# Patient Record
Sex: Female | Born: 1970 | Hispanic: No | Marital: Married | Smoking: Former smoker
Health system: Southern US, Community
[De-identification: ages and names within clinical notes are randomized; demographics above are authoritative.]

## PROBLEM LIST (undated history)

## (undated) DIAGNOSIS — I10 Essential (primary) hypertension: Secondary | ICD-10-CM

## (undated) DIAGNOSIS — Z789 Other specified health status: Secondary | ICD-10-CM

## (undated) DIAGNOSIS — T7840XA Allergy, unspecified, initial encounter: Secondary | ICD-10-CM

## (undated) DIAGNOSIS — Q232 Congenital mitral stenosis: Secondary | ICD-10-CM

## (undated) DIAGNOSIS — G709 Myoneural disorder, unspecified: Secondary | ICD-10-CM

## (undated) HISTORY — DX: Essential (primary) hypertension: I10

## (undated) HISTORY — PX: NO PAST SURGERIES: SHX2092

## (undated) HISTORY — DX: Allergy, unspecified, initial encounter: T78.40XA

## (undated) HISTORY — DX: Congenital mitral stenosis: Q23.2

## (undated) HISTORY — DX: Myoneural disorder, unspecified: G70.9

---

## 2005-01-28 ENCOUNTER — Other Ambulatory Visit: Admission: RE | Admit: 2005-01-28 | Discharge: 2005-01-28 | Payer: Self-pay | Admitting: Obstetrics and Gynecology

## 2013-09-07 DIAGNOSIS — G35 Multiple sclerosis: Secondary | ICD-10-CM | POA: Insufficient documentation

## 2013-09-12 ENCOUNTER — Inpatient Hospital Stay (HOSPITAL_COMMUNITY)
Admission: EM | Admit: 2013-09-12 | Discharge: 2013-09-14 | DRG: 060 | Disposition: A | Discharge status: Home Health | Payer: BC Managed Care – PPO | Attending: Internal Medicine | Admitting: Internal Medicine

## 2013-09-12 ENCOUNTER — Encounter (HOSPITAL_COMMUNITY): Payer: Self-pay | Admitting: Emergency Medicine

## 2013-09-12 ENCOUNTER — Emergency Department (HOSPITAL_COMMUNITY): Payer: BC Managed Care – PPO

## 2013-09-12 DIAGNOSIS — R209 Unspecified disturbances of skin sensation: Secondary | ICD-10-CM

## 2013-09-12 DIAGNOSIS — G35 Multiple sclerosis: Principal | ICD-10-CM | POA: Diagnosis present

## 2013-09-12 DIAGNOSIS — R200 Anesthesia of skin: Secondary | ICD-10-CM | POA: Diagnosis present

## 2013-09-12 DIAGNOSIS — Z87891 Personal history of nicotine dependence: Secondary | ICD-10-CM

## 2013-09-12 HISTORY — DX: Other specified health status: Z78.9

## 2013-09-12 LAB — BASIC METABOLIC PANEL
BUN: 8 mg/dL (ref 6–23)
CO2: 24 mEq/L (ref 19–32)
Chloride: 105 mEq/L (ref 96–112)
Creatinine, Ser: 0.71 mg/dL (ref 0.50–1.10)
GFR calc Af Amer: >90 mL/min (ref >90)
Glucose, Bld: 84 mg/dL (ref 70–99)
Potassium: 4 mEq/L (ref 3.5–5.1)
Sodium: 139 mEq/L (ref 135–145)

## 2013-09-12 LAB — CBC WITH DIFFERENTIAL/PLATELET
Basophils Relative: 0 % (ref 0–1)
Eosinophils Absolute: 0.1 10*3/uL (ref 0.0–0.7)
HCT: 40.5 % (ref 36.0–46.0)
Hemoglobin: 13.7 g/dL (ref 12.0–15.0)
Lymphs Abs: 1.8 10*3/uL (ref 0.7–4.0)
MCH: 30 pg (ref 26.0–34.0)
MCHC: 33.8 g/dL (ref 30.0–36.0)
Monocytes Absolute: 0.5 10*3/uL (ref 0.1–1.0)
Monocytes Relative: 6 % (ref 3–12)
Neutro Abs: 5.6 10*3/uL (ref 1.7–7.7)
Neutrophils Relative %: 70 % (ref 43–77)
RBC: 4.57 MIL/uL (ref 3.87–5.11)

## 2013-09-12 LAB — PREGNANCY, URINE: Preg Test, Ur: NEGATIVE

## 2013-09-12 LAB — SEDIMENTATION RATE: Sed Rate: 3 mm/hr (ref 0–22)

## 2013-09-12 MED ORDER — ONDANSETRON HCL 4 MG PO TABS: 4.0000 mg | ORAL_TABLET | Freq: Four times a day (QID) | ORAL | Status: DC | PRN

## 2013-09-12 MED ORDER — ACETAMINOPHEN 325 MG PO TABS
650.0000 mg | ORAL_TABLET | Freq: Four times a day (QID) | ORAL | Status: DC | PRN
  Administered 2013-09-14: 650 mg via ORAL
  Filled 2013-09-12: qty 2

## 2013-09-12 MED ORDER — GADOBENATE DIMEGLUMINE 529 MG/ML IV SOLN
15.0000 mL | Freq: Once | INTRAVENOUS | Status: AC
  Administered 2013-09-12: 15 mL via INTRAVENOUS

## 2013-09-12 MED ORDER — ACETAMINOPHEN 650 MG RE SUPP: 650.0000 mg | Freq: Four times a day (QID) | RECTAL | Status: DC | PRN

## 2013-09-12 MED ORDER — SODIUM CHLORIDE 0.9 % IV SOLN
INTRAVENOUS | Status: DC
  Administered 2013-09-13: 15:00:00 via INTRAVENOUS

## 2013-09-12 MED ORDER — ONDANSETRON HCL 4 MG/2ML IJ SOLN: 4.0000 mg | Freq: Four times a day (QID) | INTRAMUSCULAR | Status: DC | PRN

## 2013-09-12 NOTE — Progress Notes (Signed)
Called to get report. RN will call this nurse back.

## 2013-09-12 NOTE — ED Notes (Signed)
Family at bedside, patient is still in MRI.

## 2013-09-12 NOTE — ED Provider Notes (Signed)
Full history, physical exam, and ROS can be read from [facility], PA-C note.  Physical Exam  BP 128/85  Pulse 86  Temp(Src) 99.3 F (37.4 C) (Oral)  Resp 16  Wt 135 lb 3.2 oz (61.326 kg)  SpO2 99%  Physical Exam  ED Course  Procedures  MR Thoracic Spine W Wo Contrast (Final result)  Result time: 09/12/13 16:48:17    Final result by Rad Results In Interface (09/12/13 16:48:17)    Narrative:   CLINICAL DATA: Bilateral lower extremity numbness.  EXAM: MRI THORACIC SPINE WITHOUT AND WITH CONTRAST  TECHNIQUE: Multiplanar and multiecho pulse sequences of the thoracic spine were obtained without and with intravenous contrast.  CONTRAST: 15mL MULTIHANCE GADOBENATE DIMEGLUMINE 529 MG/ML IV SOLN  COMPARISON: None.  FINDINGS: The sagittal MRI images demonstrate normal alignment of the thoracic vertebral bodies. They demonstrate normal marrow signal. There is a mild left convex thoracic scoliosis. The thoracic spinal cord demonstrates multiple cord lesions with slight increased T2 and STIR signal intensity. Lesions are noted at T7, T7-8, T10 and T10-11. No definite enhancement postcontrast.  No significant thoracic disc protrusions, spinal or foraminal stenosis. There is a small left paracentral disc protrusion noted at T8-9 and a bulging annulus at T11-12.  IMPRESSION: Thoracic cord lesions suspicious for MS plaques as discussed above. No enhancement.   Electronically Signed By: Loralie Champagne M.D. On: 09/12/2013 16:48             MR Cervical Spine W Wo Contrast (Final result)  Result time: 09/12/13 16:42:20    Final result by Rad Results In Interface (09/12/13 16:42:20)    Narrative:   CLINICAL DATA: Extremity numbness.  EXAM: MRI CERVICAL SPINE WITHOUT AND WITH CONTRAST  TECHNIQUE: Multiplanar and multiecho pulse sequences of the cervical spine, to include the craniocervical junction and cervicothoracic junction, were obtained according to standard  protocol without and with intravenous contrast.  CONTRAST: 15mL MULTIHANCE GADOBENATE DIMEGLUMINE 529 MG/ML IV SOLN  COMPARISON: None.  FINDINGS: Normal alignment of the cervical vertebral bodies. They demonstrate normal marrow signal. The cervical spinal cord demonstrates ill-defined areas of increased T2 and STIR signal intensity. These are concerning for MS plaques. There is a small lesion in the right side of the cord at C4-5 and a larger left-sided cord lesion at C5-6. Suspect a vague lesion at the C2 level and also in the upper pons. No enhancement is identified after contrast administration.  No focal disc protrusions, spinal or foraminal stenosis.  IMPRESSION: MR findings suspicious for multiple sclerosis plaques involving the cervical spine. Lesions are noted at C4-5, C5-6 and also at the C2 level. An upper pons lesion is also noted. MRI of the brain may also be helpful for further evaluation of the remainder of the neural axis.   Electronically Signed By: Loralie Champagne M.D. On: 09/12/2013 16:42     MDM  Patient case discussed with Dr. Leroy Kennedy who recommends inpatient admission for full work up of MS plaques. Patient is agreeable to plan. The patient appears reasonably stabilized for admission considering the current resources, flow, and capabilities available in the ED at this time, and I doubt any other [facility] requiring further screening and/or treatment in the ED prior to admission. Patient d/w with Dr. Wilkie Aye, agrees with plan.         Jeannetta Ellis, PA-C 09/12/13 1917

## 2013-09-12 NOTE — Consult Note (Signed)
NEURO HOSPITALIST CONSULT NOTE    Reason for Consult: paresthesias left lateral middle thoracic-lumbar area and anterior left thigh.   HPI:                                                                                                                                          Kelli Bright is an 42 y.o. female without significant past medical history who was in her usual state of health until 3 days ago when she started experiencing " a strange novocain-like sensation" that initially was localized to the lateral midthoracic-lumbar region without involvement of the ribcage area and subsequently spread to the anterior aspect of the left thigh, left hip area and the front of her left genitalia region. Stated that she probably had some transient tingling of the left arm. Denies weakness of the upper or lower extremities, bladder or bowel impairment, unsteadiness, vertigo, double vision, slurred speech, painful visual loss, painful cramps, difficulty swallowing, or HA. No recent fever, infection, trauma, vaccination, or foreign travel. Her symptoms prompted MRI cervical and thoracic region with and without contras.The cervical spinal cord demonstrates ill-defined areas of increased T2 and STIR signal intensity. These are concerning for MS plaques. There is a small lesion in the right side of the cord at C4-5 and a larger left-sided cord lesion at C5-6. A vague lesion at the C2 level and also in the upper pons also suspected. No enhancement is identified after contrast administration. The thoracic spinal cord demonstrates multiple cord lesions with slight increased T2 and STIR  signal intensity. Lesions are noted at T7, T7-8, T10 and T10-11. No definite enhancement postcontrast.   History reviewed. No pertinent past medical history.  History reviewed. No pertinent past surgical history.  History reviewed. No pertinent family history.  Social History:  reports that she has quit  smoking. She does not have any smokeless tobacco history on file. She reports that she does not drink alcohol or use illicit drugs.  No Known Allergies  MEDICATIONS:                                                                                                                     I have reviewed the patient's current medications.   ROS:  History obtained from the patient  General ROS: negative for - chills, fatigue, fever, night sweats, weight gain or weight loss Psychological ROS: negative for - behavioral disorder, hallucinations, memory difficulties, mood swings or suicidal ideation Ophthalmic ROS: negative for - blurry vision, double vision, eye pain or loss of vision ENT ROS: negative for - epistaxis, nasal discharge, oral lesions, sore throat, tinnitus or vertigo Allergy and Immunology ROS: negative for - hives or itchy/watery eyes Hematological and Lymphatic ROS: negative for - bleeding problems, bruising or swollen lymph nodes Endocrine ROS: negative for - galactorrhea, hair pattern changes, polydipsia/polyuria or temperature intolerance Respiratory ROS: negative for - cough, hemoptysis, shortness of breath or wheezing Cardiovascular ROS: negative for - chest pain, dyspnea on exertion, edema or irregular heartbeat Gastrointestinal ROS: negative for - abdominal pain, diarrhea, hematemesis, nausea/vomiting or stool incontinence Genito-Urinary ROS: negative for - dysuria, hematuria, incontinence or urinary frequency/urgency Musculoskeletal ROS: negative for - joint swelling or muscular weakness Neurological ROS: as noted in HPI Dermatological ROS: negative for rash and skin lesion changes  Physical exam: pleasant female in no apparent distress. Blood pressure 128/85, pulse 86, temperature 99.3 F (37.4 C), temperature source Oral, resp. rate 16, weight  61.326 kg (135 lb 3.2 oz), SpO2 99.00%. Head: normocephalic. Neck: supple, no bruits, no JVD. Cardiac: no murmurs. Lungs: clear. Abdomen: soft, no tender, no mass. Extremities: no edema.  Neurologic Examination:                                                                                                      Mental Status: Alert, oriented, thought content appropriate.  Speech fluent without evidence of aphasia.  Able to follow 3 step commands without difficulty. Cranial Nerves: II: Discs flat bilaterally; Visual fields grossly normal, pupils equal, round, reactive to light and accommodation III,IV, VI: ptosis not present, extra-ocular motions intact bilaterally V,VII: smile symmetric, facial light touch sensation normal bilaterally VIII: hearing normal bilaterally IX,X: gag reflex present XI: bilateral shoulder shrug XII: midline tongue extension without atrophy or fasciculations  Motor: Right : Upper extremity   5/5    Left:     Upper extremity   5/5  Lower extremity   5/5     Lower extremity   5/5 Tone and bulk:normal tone throughout; no atrophy noted Sensory: there is a large area of decreased pinprick and light touch diminished involving the left lateral middle thoracic-lumbar area and anterior left thigh.  Deep Tendon Reflexes:  Significant for hyperreflexia at the knee and ankle jerk bilaterally. No obvious clonus. Plantars: Right: downgoing   Left: downgoing Cerebellar: normal finger-to-nose,  normal heel-to-shin test Gait:  No ataxia. CV: pulses palpable throughout   No results found for this basename: cbc, bmp, coags, chol, tri, ldl, hga1c    Results for orders placed during the hospital encounter of 09/12/13 (from the past 48 hour(s))  CBC WITH DIFFERENTIAL     Status: None   Collection Time    09/12/13  1:15 PM      Result Value Range   WBC 7.9  4.0 - 10.5 K/uL  RBC 4.57  3.87 - 5.11 MIL/uL   Hemoglobin 13.7  12.0 - 15.0 g/dL   HCT 46.9  62.9 - 52.8 %    MCV 88.6  78.0 - 100.0 fL   MCH 30.0  26.0 - 34.0 pg   MCHC 33.8  30.0 - 36.0 g/dL   RDW 41.3  24.4 - 01.0 %   Platelets 240  150 - 400 K/uL   Neutrophils Relative % 70  43 - 77 %   Neutro Abs 5.6  1.7 - 7.7 K/uL   Lymphocytes Relative 23  12 - 46 %   Lymphs Abs 1.8  0.7 - 4.0 K/uL   Monocytes Relative 6  3 - 12 %   Monocytes Absolute 0.5  0.1 - 1.0 K/uL   Eosinophils Relative 1  0 - 5 %   Eosinophils Absolute 0.1  0.0 - 0.7 K/uL   Basophils Relative 0  0 - 1 %   Basophils Absolute 0.0  0.0 - 0.1 K/uL  BASIC METABOLIC PANEL     Status: None   Collection Time    09/12/13  1:15 PM      Result Value Range   Sodium 139  135 - 145 mEq/L   Potassium 4.0  3.5 - 5.1 mEq/L   Chloride 105  96 - 112 mEq/L   CO2 24  19 - 32 mEq/L   Glucose, Bld 84  70 - 99 mg/dL   BUN 8  6 - 23 mg/dL   Creatinine, Ser 2.72  0.50 - 1.10 mg/dL   Calcium 8.9  8.4 - 53.6 mg/dL   GFR calc non Af Amer >90  >90 mL/min   GFR calc Af Amer >90  >90 mL/min   Comment: (NOTE)     The eGFR has been calculated using the CKD EPI equation.     This calculation has not been validated in all clinical situations.     eGFR's persistently <90 mL/min signify possible Chronic Kidney     Disease.    Mr Cervical Spine W Wo Contrast  09/12/2013   CLINICAL DATA:  Extremity numbness.  EXAM: MRI CERVICAL SPINE WITHOUT AND WITH CONTRAST  TECHNIQUE: Multiplanar and multiecho pulse sequences of the cervical spine, to include the craniocervical junction and cervicothoracic junction, were obtained according to standard protocol without and with intravenous contrast.  CONTRAST:  15mL MULTIHANCE GADOBENATE DIMEGLUMINE 529 MG/ML IV SOLN  COMPARISON:  None.  FINDINGS: Normal alignment of the cervical vertebral bodies. They demonstrate normal marrow signal. The cervical spinal cord demonstrates ill-defined areas of increased T2 and STIR signal intensity. These are concerning for MS plaques. There is a small lesion in the right side of the cord at  C4-5 and a larger left-sided cord lesion at C5-6. Suspect a vague lesion at the C2 level and also in the upper pons. No enhancement is identified after contrast administration.  No focal disc protrusions, spinal or foraminal stenosis.  IMPRESSION: MR findings suspicious for multiple sclerosis plaques involving the cervical spine. Lesions are noted at C4-5, C5-6 and also at the C2 level. An upper pons lesion is also noted. MRI of the brain may also be helpful for further evaluation of the remainder of the neural axis.   Electronically Signed   By: Loralie Champagne M.D.   On: 09/12/2013 16:42   Mr Thoracic Spine W Wo Contrast  09/12/2013   CLINICAL DATA:  Bilateral lower extremity numbness.  EXAM: MRI THORACIC SPINE WITHOUT AND WITH CONTRAST  TECHNIQUE:  Multiplanar and multiecho pulse sequences of the thoracic spine were obtained without and with intravenous contrast.  CONTRAST:  15mL MULTIHANCE GADOBENATE DIMEGLUMINE 529 MG/ML IV SOLN  COMPARISON:  None.  FINDINGS: The sagittal MRI images demonstrate normal alignment of the thoracic vertebral bodies. They demonstrate normal marrow signal. There is a mild left convex thoracic scoliosis. The thoracic spinal cord demonstrates multiple cord lesions with slight increased T2 and STIR signal intensity. Lesions are noted at T7, T7-8, T10 and T10-11. No definite enhancement postcontrast.  No significant thoracic disc protrusions, spinal or foraminal stenosis. There is a small left paracentral disc protrusion noted at T8-9 and a bulging annulus at T11-12.  IMPRESSION: Thoracic cord lesions suspicious for MS plaques as discussed above. No enhancement.   Electronically Signed   By: Loralie Champagne M.D.   On: 09/12/2013 16:48     Assessment/Plan: 42 y/o female with new onset paresthesias involving left lateral middle thoracic-lumbar area, anterior left thigh, and genitalia area and MRI cervico-thoracic spine revealing multiple non enhancing cord lesions highly concerning  for a demyelinating disorder. I had an ample conversation with patient and husband and discussed several potential etiologies for her clinical and radiological syndrome and suggested admission to the hospital in order to complete the work up with MRI brain with and without contrast, LP, and serological testing to ruled out MS mimics.  Patient agreed to stay in the hospital but stated she doesn't want symptomatic treatment with IV steroids at this time. Will follow up.   Wyatt Portela, MD [facility] [phone]  09/12/2013, 6:18 PM

## 2013-09-12 NOTE — H&P (Signed)
[facility] History and Physical  Kelli Bright ZOX:[id number] DOB: 30-Dec-1970 DOA: 09/12/2013  Referring physician: ER physician. PCP: No PCP Per Patient   Chief Complaint: Numbness of the left flank area.  HPI: Kelli Bright is a 42 y.o. female with no significant past medical history has been experiencing some numbness of the flank area over the last 4-5 days which has been gradually worsening. Patient denies any weakness of the upper extremities loss of consciousness or any blurred vision. Denies any difficulty speaking or swallowing or any incontinence of urine. 6 months ago patient had some difficulty seeing in the left eye for one or 2 hours and resolved spontaneously. Patient in addition the left flank numbness has mild numbness in the left anterior thigh. In the ER patient was seen by neurologist Dr. Cyril Mourning and MRI of the C-spine and T-spine were ordered which at this time shows features concerning for possible multiple sclerosis. Patient has been admitted for further management. Patient otherwise denies any chest pain shortness nausea vomiting abdominal pain. Patient has been having some chronic headaches.   Review of Systems: As presented in the history of presenting illness, rest negative.  Past Medical History  Diagnosis Date  . Medical history non-contributory    Past Surgical History  Procedure Laterality Date  . No past surgeries     Social History:  reports that she has quit smoking. She does not have any smokeless tobacco history on file. She reports that she does not drink alcohol or use illicit drugs. Where does patient live home. Can patient participate in ADLs? Yes.  No Known Allergies  Family History:  Family History  Problem Relation Age of Onset  . Hypertension Mother   . Skin cancer Mother   . CAD Father   . Hypertension Father   . Skin cancer Father       Prior to Admission medications   Medication Sig Start Date End Date Taking?  Authorizing Provider  ibuprofen (ADVIL,MOTRIN) 200 MG tablet Take 400 mg by mouth every 8 (eight) hours as needed for headache.    Yes Historical Provider, MD  Methylcellulose, Laxative, (CITRUCEL PO) Take 1 tablet by mouth daily as needed (for constipation).   Yes Historical Provider, MD  Norgestimate-Ethinyl Estradiol Triphasic (ORTHO TRI-CYCLEN, 28,) 0.18/0.215/0.25 MG-35 MCG tablet Take 1 tablet by mouth daily.   Yes Historical Provider, MD    Physical Exam: Filed Vitals:   09/12/13 1631 09/12/13 1826 09/12/13 1915 09/12/13 1934  BP: 128/85 142/83 102/87 102/87  Pulse: 86 95  94  Temp:  98.1 F (36.7 C)  98.2 F (36.8 C)  TempSrc:  Oral  Oral  Resp: 16 16  18   Weight:      SpO2: 99% 98%  99%     General:  Well-developed well-nourished.  Eyes: Anicteric no pallor.  ENT: No discharge from the ears eyes nose mouth.  Neck: No mass felt.  Cardiovascular: S1-S2 heard.  Respiratory: No rhonchi or crepitations.  Abdomen: Soft nontender bowel sounds present.  Skin: No rash.  Musculoskeletal: No edema.  Psychiatric: Appears normal.  Neurologic: Alert awake oriented to time place and person. Moves all extremities 5 x 5. No facial asymmetry. Tongue is midline.  Labs on Admission:  Basic Metabolic Panel:  Recent Labs Lab 09/12/13 1315  NA 139  K 4.0  CL 105  CO2 24  GLUCOSE 84  BUN 8  CREATININE 0.71  CALCIUM 8.9   Liver Function Tests: No results found for this basename: AST, ALT,  ALKPHOS, BILITOT, PROT, ALBUMIN,  in the last 168 hours No results found for this basename: LIPASE, AMYLASE,  in the last 168 hours No results found for this basename: AMMONIA,  in the last 168 hours CBC:  Recent Labs Lab 09/12/13 1315  WBC 7.9  NEUTROABS 5.6  HGB 13.7  HCT 40.5  MCV 88.6  PLT 240   Cardiac Enzymes: No results found for this basename: CKTOTAL, CKMB, CKMBINDEX, TROPONINI,  in the last 168 hours  BNP (last 3 results) No results found for this basename:  PROBNP,  in the last 8760 hours CBG: No results found for this basename: GLUCAP,  in the last 168 hours  Radiological Exams on Admission: Mr Cervical Spine W Wo Contrast  09/12/2013   CLINICAL DATA:  Extremity numbness.  EXAM: MRI CERVICAL SPINE WITHOUT AND WITH CONTRAST  TECHNIQUE: Multiplanar and multiecho pulse sequences of the cervical spine, to include the craniocervical junction and cervicothoracic junction, were obtained according to standard protocol without and with intravenous contrast.  CONTRAST:  15mL MULTIHANCE GADOBENATE DIMEGLUMINE 529 MG/ML IV SOLN  COMPARISON:  None.  FINDINGS: Normal alignment of the cervical vertebral bodies. They demonstrate normal marrow signal. The cervical spinal cord demonstrates ill-defined areas of increased T2 and STIR signal intensity. These are concerning for MS plaques. There is a small lesion in the right side of the cord at C4-5 and a larger left-sided cord lesion at C5-6. Suspect a vague lesion at the C2 level and also in the upper pons. No enhancement is identified after contrast administration.  No focal disc protrusions, spinal or foraminal stenosis.  IMPRESSION: MR findings suspicious for multiple sclerosis plaques involving the cervical spine. Lesions are noted at C4-5, C5-6 and also at the C2 level. An upper pons lesion is also noted. MRI of the brain may also be helpful for further evaluation of the remainder of the neural axis.   Electronically Signed   By: Loralie Champagne M.D.   On: 09/12/2013 16:42   Mr Thoracic Spine W Wo Contrast  09/12/2013   CLINICAL DATA:  Bilateral lower extremity numbness.  EXAM: MRI THORACIC SPINE WITHOUT AND WITH CONTRAST  TECHNIQUE: Multiplanar and multiecho pulse sequences of the thoracic spine were obtained without and with intravenous contrast.  CONTRAST:  15mL MULTIHANCE GADOBENATE DIMEGLUMINE 529 MG/ML IV SOLN  COMPARISON:  None.  FINDINGS: The sagittal MRI images demonstrate normal alignment of the thoracic  vertebral bodies. They demonstrate normal marrow signal. There is a mild left convex thoracic scoliosis. The thoracic spinal cord demonstrates multiple cord lesions with slight increased T2 and STIR signal intensity. Lesions are noted at T7, T7-8, T10 and T10-11. No definite enhancement postcontrast.  No significant thoracic disc protrusions, spinal or foraminal stenosis. There is a small left paracentral disc protrusion noted at T8-9 and a bulging annulus at T11-12.  IMPRESSION: Thoracic cord lesions suspicious for MS plaques as discussed above. No enhancement.   Electronically Signed   By: Loralie Champagne M.D.   On: 09/12/2013 16:48     Assessment/Plan Principal Problem:   Numbness and tingling   1. Tingling and numbness of the left flank area and left anterior thigh - patient's symptoms and MRI findings are concerning for multiple sclerosis. MRI of the brain is pending. Further recommendations per neurologist.    Code Status: Full code.  Family Communication: None.  Disposition Plan: Admit to inpatient.    Werner Labella N. [facility] Pager [phone].  If 7PM-7AM, please contact night-coverage [email or web address] Password [facility] 09/12/2013,  8:10 PM

## 2013-09-12 NOTE — ED Notes (Signed)
Patient is resting comfortably, husband at bedside.  Offered him a coke.

## 2013-09-12 NOTE — ED Notes (Signed)
Dr. Kakrakandy at bedside. 

## 2013-09-12 NOTE — ED Notes (Signed)
Family at bedside. 

## 2013-09-12 NOTE — ED Notes (Signed)
Pt c/o numbness starting in left trunk area to left knee x 3 days; pt denies other sx at present

## 2013-09-12 NOTE — ED Provider Notes (Signed)
CSN: [id number]     Arrival date & time 09/12/13  1123 History   First MD Initiated Contact with Patient 09/12/13 1152     Chief Complaint  Patient presents with  . Numbness   (Consider location/radiation/quality/duration/timing/severity/associated sxs/prior Treatment) HPI Patient reports 4 days of gradual onset left trunk numbness.  States from just under her left ribs to her left buttock she has decreased sensation that "feels like novocain" Denies fevers, neck pain, weakness or numbness of the arms or legs, visual changes, and focal neurological deficits.  No family hx neurologic problems or early strokes.  Patient has no medical problems.  Denies any trauma.  Denies recent travel, denies tick bites.    History reviewed. No pertinent past medical history. History reviewed. No pertinent past surgical history. History reviewed. No pertinent family history. History  Substance Use Topics  . Smoking status: Former Games developer  . Smokeless tobacco: Not on file  . Alcohol Use: No   OB History   Grav Para Term Preterm Abortions TAB SAB Ect Mult Living                 Review of Systems  Constitutional: Negative for fever.  Respiratory: Negative for cough and shortness of breath.   Cardiovascular: Negative for chest pain.  Gastrointestinal: Negative for nausea, vomiting, abdominal pain and diarrhea.  Genitourinary: Negative for dysuria, urgency, frequency, vaginal bleeding and vaginal discharge.    Allergies  Review of patient's allergies indicates no known allergies.  Home Medications   Current Outpatient Rx  Name  Route  Sig  Dispense  Refill  . ibuprofen (ADVIL,MOTRIN) 200 MG tablet   Oral   Take 400 mg by mouth every 8 (eight) hours as needed for headache.          . Methylcellulose, Laxative, (CITRUCEL PO)   Oral   Take 1 tablet by mouth daily as needed (for constipation).         . Norgestimate-Ethinyl Estradiol Triphasic (ORTHO TRI-CYCLEN, 28,) 0.18/0.215/0.25 MG-35  MCG tablet   Oral   Take 1 tablet by mouth daily.          BP 141/76  Pulse 110  Temp(Src) 99.3 F (37.4 C) (Oral)  Wt 135 lb 3.2 oz (61.326 kg)  SpO2 100% Physical Exam  Nursing note and vitals reviewed. Constitutional: She appears well-developed and well-nourished. No distress.  HENT:  Head: Normocephalic and atraumatic.  Neck: Neck supple.  Cardiovascular: Normal rate, regular rhythm and intact distal pulses.   Pulmonary/Chest: Effort normal and breath sounds normal. No respiratory distress. She has no wheezes. She has no rales.  Abdominal: Soft. She exhibits no distension. There is no tenderness. There is no rebound and no guarding.  Musculoskeletal: Normal range of motion. She exhibits no edema and no tenderness.  Neurological: She is alert. She has normal strength. No cranial nerve deficit. GCS eye subscore is 4. GCS verbal subscore is 5. GCS motor subscore is 6.  Decreased sensation to light touch from approximately T9-L2 on left, does not cross midline. Sharp/dull intact, cold sensation intact.    CN II-XII intact, EOMs intact, no pronator drift, grip strengths equal bilaterally; strength 5/5 in all extremities, sensation intact in all extremities; finger to nose, heel to shin, rapid alternating movements normal; gait is normal.     Skin: She is not diaphoretic.    ED Course  Procedures (including critical care time) Labs Review Labs Reviewed  CBC WITH DIFFERENTIAL  BASIC METABOLIC PANEL   Imaging Review  No results found.  EKG Interpretation     Ventricular Rate:  94 PR Interval:  134 QRS Duration: 82 QT Interval:  336 QTC Calculation: 420 R Axis:   73 Text Interpretation:  Normal sinus rhythm with sinus arrhythmia Normal ECG           Discussed pt with Dr Gwendolyn Grant who has also seen the patient.  Discussed patient with neurohospitalist who recommended MR with and without of cervical and thoracic spine, no L spine MR needed per neuro hospitalist.  MDM   No diagnosis found.   Pt with numbness to left trunk and left buttock, it is dermatomal.  MR c-spine, t-spine pending at change of shift.  Discussed pt with Tyler Deis, PA-C, who assumes care of patient pending MR result.     Trixie Dredge, PA-C 09/12/13 [phone]

## 2013-09-12 NOTE — ED Notes (Signed)
Patient transported back to [facility] from MRI.

## 2013-09-12 NOTE — ED Notes (Signed)
Patient transported to MRI 

## 2013-09-12 NOTE — ED Notes (Signed)
Dr. Thad Ranger made aware that pt is unable to have another MRI with contrast performed for another 48 hours.

## 2013-09-12 NOTE — Progress Notes (Signed)
Received report from Melanie, RN

## 2013-09-13 DIAGNOSIS — G35 Multiple sclerosis: Principal | ICD-10-CM

## 2013-09-13 LAB — BASIC METABOLIC PANEL
Calcium: 9 mg/dL (ref 8.4–10.5)
Creatinine, Ser: 0.76 mg/dL (ref 0.50–1.10)
GFR calc non Af Amer: >90 mL/min (ref >90)
Glucose, Bld: 85 mg/dL (ref 70–99)
Potassium: 4.2 mEq/L (ref 3.5–5.1)
Sodium: 138 mEq/L (ref 135–145)

## 2013-09-13 LAB — CBC
MCH: 30.7 pg (ref 26.0–34.0)
MCV: 89.8 fL (ref 78.0–100.0)
Platelets: 261 10*3/uL (ref 150–400)
RBC: 4.49 MIL/uL (ref 3.87–5.11)
RDW: 12.5 % (ref 11.5–15.5)

## 2013-09-13 LAB — SJOGRENS SYNDROME-A EXTRACTABLE NUCLEAR ANTIBODY: SSA (Ro) (ENA) Antibody, IgG: 17 AU/mL (ref <30)

## 2013-09-13 LAB — B. BURGDORFI ANTIBODIES: B burgdorferi Ab IgG+IgM: 0.25 {ISR}

## 2013-09-13 LAB — SJOGRENS SYNDROME-B EXTRACTABLE NUCLEAR ANTIBODY: SSB (La) (ENA) Antibody, IgG: <1 AU/mL (ref <30)

## 2013-09-13 LAB — RPR: RPR Ser Ql: NONREACTIVE

## 2013-09-13 LAB — TSH: TSH: 2.515 u[IU]/mL (ref 0.350–4.500)

## 2013-09-13 MED ORDER — SODIUM CHLORIDE 0.9 % IV SOLN
1000.0000 mg | Freq: Every day | INTRAVENOUS | Status: DC
  Administered 2013-09-13 – 2013-09-14 (×2): 1000 mg via INTRAVENOUS
  Filled 2013-09-13 (×4): qty 8

## 2013-09-13 NOTE — Progress Notes (Signed)
Utilization review completed. Leeyah Heather, RN, BSN. 

## 2013-09-13 NOTE — ED Provider Notes (Signed)
Medical screening examination/treatment/procedure(s) were performed by non-physician practitioner and as supervising physician I was immediately available for consultation/collaboration.  EKG Interpretation     Ventricular Rate:  94 PR Interval:  134 QRS Duration: 82 QT Interval:  336 QTC Calculation: 420 R Axis:   73 Text Interpretation:  Normal sinus rhythm with sinus arrhythmia Normal ECG             Shon Baton, MD 09/13/13 1438

## 2013-09-13 NOTE — Progress Notes (Signed)
[facility]                                                                                Patient Demographics  Kelli Bright, is a 42 y.o. female, DOB - 23-Nov-1970, ZOX:[id number]  Admit date - 09/12/2013   Admitting Physician Eduard Clos, MD  Outpatient Primary MD for the patient is No PCP Per Patient  LOS - 1   Chief Complaint  Patient presents with  . Numbness        Assessment & Plan    Principal Problem:   Numbness and tingling  Numbness/Tingling of Left abdominal area/back/anterior thigh -MRI concerning for multiple sclerosis -MRI of the brain pending (will be done 09/14/2013 due to contrast) -Neurology consulted -Possible LP today  Code Status: Full  Family Communication: Husband at bedside  Disposition Plan: Patient admitted.  Pending MRI and LP.  Procedures None  Consults  Neurology  DVT Prophylaxis SCDs  Lab Results  Component Value Date   PLT 261 09/13/2013    Medications  Scheduled Meds:  Continuous Infusions: . sodium chloride     PRN Meds:.acetaminophen, acetaminophen, ondansetron (ZOFRAN) IV, ondansetron  Antibiotics   Anti-infectives   None      Time Spent in minutes   30 minutes   Suhas Estis D.O. on 09/13/2013 at 12:23 PM  Between 7am to 7pm - Pager - [phone]  After 7pm go to [email or web address] - password [email or web address]  And look for the night coverage person covering for me after hours  [facility] Office  [phone]    Subjective:   Kelli Bright seen and examined today.  Patient is anxious regarding her current prognosis.  Still complains of the numbness and tingling.  Patient denies dizziness, chest pain, shortness of breath, abdominal pain, N/V/D/C, new weakness.  Objective:   Filed Vitals:   09/12/13 2034 09/13/13 0207 09/13/13 0559 09/13/13 0956  BP: 134/83 120/79 144/73 136/83  Pulse: 98 84 82 91  Temp: 98.9 F (37.2 C) 98.5 F (36.9 C) 98.6 F (37 C) 97.9 F (36.6 C)   TempSrc: Oral Oral Oral Oral  Resp: 18 18 18 18   Height: 5\' 1"  (1.549 m)     Weight: 60.011 kg (132 lb 4.8 oz)     SpO2: 99% 100% 100% 100%    Wt Readings from Last 3 Encounters:  09/12/13 60.011 kg (132 lb 4.8 oz)    No intake or output data in the 24 hours ending 09/13/13 1223  Exam  General: Well developed, well nourished, NAD, appears stated age  HEENT: NCAT, PERRLA, EOMI, Anicteic Sclera, mucous membranes moist.  Neck: Supple, no JVD, no masses  Cardiovascular: S1 S2 auscultated, no rubs, murmurs or gallops. Regular rate and rhythm.  Respiratory: Clear to auscultation bilaterally with equal chest rise  Abdomen: Soft, nontender, nondistended, + bowel sounds  Extremities: warm dry without cyanosis clubbing or edema  Neuro: AAOx3, cranial nerves grossly intact. Strength 5/5 in patient's upper and lower extremities bilaterally  Skin: Without rashes exudates or nodules  Psych: Normal affect and demeanor with intact judgement and insight, anxious  Data Review   Micro Results No results found for this or any previous visit (  from the past 240 hour(s)).  Radiology Reports Mr Cervical Spine W Wo Contrast  09/12/2013   CLINICAL DATA:  Extremity numbness.  EXAM: MRI CERVICAL SPINE WITHOUT AND WITH CONTRAST  TECHNIQUE: Multiplanar and multiecho pulse sequences of the cervical spine, to include the craniocervical junction and cervicothoracic junction, were obtained according to standard protocol without and with intravenous contrast.  CONTRAST:  15mL MULTIHANCE GADOBENATE DIMEGLUMINE 529 MG/ML IV SOLN  COMPARISON:  None.  FINDINGS: Normal alignment of the cervical vertebral bodies. They demonstrate normal marrow signal. The cervical spinal cord demonstrates ill-defined areas of increased T2 and STIR signal intensity. These are concerning for MS plaques. There is a small lesion in the right side of the cord at C4-5 and a larger left-sided cord lesion at C5-6. Suspect a vague lesion  at the C2 level and also in the upper pons. No enhancement is identified after contrast administration.  No focal disc protrusions, spinal or foraminal stenosis.  IMPRESSION: MR findings suspicious for multiple sclerosis plaques involving the cervical spine. Lesions are noted at C4-5, C5-6 and also at the C2 level. An upper pons lesion is also noted. MRI of the brain may also be helpful for further evaluation of the remainder of the neural axis.   Electronically Signed   By: Loralie Champagne M.D.   On: 09/12/2013 16:42   Mr Thoracic Spine W Wo Contrast  09/12/2013   CLINICAL DATA:  Bilateral lower extremity numbness.  EXAM: MRI THORACIC SPINE WITHOUT AND WITH CONTRAST  TECHNIQUE: Multiplanar and multiecho pulse sequences of the thoracic spine were obtained without and with intravenous contrast.  CONTRAST:  15mL MULTIHANCE GADOBENATE DIMEGLUMINE 529 MG/ML IV SOLN  COMPARISON:  None.  FINDINGS: The sagittal MRI images demonstrate normal alignment of the thoracic vertebral bodies. They demonstrate normal marrow signal. There is a mild left convex thoracic scoliosis. The thoracic spinal cord demonstrates multiple cord lesions with slight increased T2 and STIR signal intensity. Lesions are noted at T7, T7-8, T10 and T10-11. No definite enhancement postcontrast.  No significant thoracic disc protrusions, spinal or foraminal stenosis. There is a small left paracentral disc protrusion noted at T8-9 and a bulging annulus at T11-12.  IMPRESSION: Thoracic cord lesions suspicious for MS plaques as discussed above. No enhancement.   Electronically Signed   By: Loralie Champagne M.D.   On: 09/12/2013 16:48    CBC  Recent Labs Lab 09/12/13 1315 09/13/13 0555  WBC 7.9 7.9  HGB 13.7 13.8  HCT 40.5 40.3  PLT 240 261  MCV 88.6 89.8  MCH 30.0 30.7  MCHC 33.8 34.2  RDW 12.6 12.5  LYMPHSABS 1.8  --   MONOABS 0.5  --   EOSABS 0.1  --   BASOSABS 0.0  --     Chemistries   Recent Labs Lab 09/12/13 1315  09/13/13 0555  NA 139 138  K 4.0 4.2  CL 105 104  CO2 24 24  GLUCOSE 84 85  BUN 8 8  CREATININE 0.71 0.76  CALCIUM 8.9 9.0   ------------------------------------------------------------------------------------------------------------------ estimated creatinine clearance is 77.8 ml/min (by C-G formula based on Cr of 0.76). ------------------------------------------------------------------------------------------------------------------ No results found for this basename: HGBA1C,  in the last 72 hours ------------------------------------------------------------------------------------------------------------------ No results found for this basename: CHOL, HDL, LDLCALC, TRIG, CHOLHDL, LDLDIRECT,  in the last 72 hours ------------------------------------------------------------------------------------------------------------------  Recent Labs  09/12/13 1841  TSH 2.515   ------------------------------------------------------------------------------------------------------------------ No results found for this basename: VITAMINB12, FOLATE, FERRITIN, TIBC, IRON, RETICCTPCT,  in the last 72 hours  Coagulation profile No results found for this basename: INR, PROTIME,  in the last 168 hours  No results found for this basename: DDIMER,  in the last 72 hours  Cardiac Enzymes No results found for this basename: CK, CKMB, TROPONINI, MYOGLOBIN,  in the last 168 hours ------------------------------------------------------------------------------------------------------------------ No components found with this basename: POCBNP,

## 2013-09-13 NOTE — Progress Notes (Signed)
NEURO HOSPITALIST PROGRESS NOTE   SUBJECTIVE:                                                                                                                        Patient continues to have similar symptoms. No significant change. Discussion about the benefit of steroids was had and patient would like to initiate Solumedrol.   OBJECTIVE:                                                                                                                           Vital signs in last 24 hours: Temp:  [97.9 F (36.6 C)-98.9 F (37.2 C)] 97.9 F (36.6 C) (11/07 0956) Pulse Rate:  [80-98] 91 (11/07 0956) Resp:  [16-18] 18 (11/07 0956) BP: (102-144)/(73-87) 136/83 mmHg (11/07 0956) SpO2:  [97 %-100 %] 100 % (11/07 0956) Weight:  [60.011 kg (132 lb 4.8 oz)] 60.011 kg (132 lb 4.8 oz) (11/06 2034)  Intake/Output from previous day:   Intake/Output this shift:   Nutritional status: Vegetarian  Past Medical History  Diagnosis Date  . Medical history non-contributory      Neurologic Exam:  Mental Status:  Alert, oriented, thought content appropriate. Speech fluent without evidence of aphasia. Able to follow 3 step commands without difficulty.  Cranial Nerves:  II: Discs flat bilaterally; Visual fields grossly normal, pupils equal, round, reactive to light and accommodation  III,IV, VI: ptosis not present, extra-ocular motions intact bilaterally  V,VII: smile symmetric, facial light touch sensation normal bilaterally  VIII: hearing normal bilaterally  IX,X: gag reflex present  XI: bilateral shoulder shrug  XII: midline tongue extension without atrophy or fasciculations  Motor:  Right : Upper extremity 5/5   Left:  Upper extremity 5/5   Lower extremity 5/5    Lower extremity 5/5  Tone and bulk:normal tone throughout; no atrophy noted  Sensory: continues to have decreased pinprick and light touch  left lateral middle thoracic-lumbar area and anterior  left thigh.  Deep Tendon Reflexes:  2+ bil UE 3+ bilateral bil. KJ and AJ Plantars:  Right: downgoing   Left: downgoing  Cerebellar:  normal finger-to-nose, normal heel-to-shin test  Gait:  No ataxia.  CV: pulses palpable throughout    Lab Results: No  results found for this basename: cbc, bmp, coags, chol, tri, ldl, hga1c   Lipid Panel No results found for this basename: CHOL, TRIG, HDL, CHOLHDL, VLDL, LDLCALC,  in the last 72 hours  Studies/Results: Mr Cervical Spine W Wo Contrast  09/12/2013   CLINICAL DATA:  Extremity numbness.  EXAM: MRI CERVICAL SPINE WITHOUT AND WITH CONTRAST  TECHNIQUE: Multiplanar and multiecho pulse sequences of the cervical spine, to include the craniocervical junction and cervicothoracic junction, were obtained according to standard protocol without and with intravenous contrast.  CONTRAST:  15mL MULTIHANCE GADOBENATE DIMEGLUMINE 529 MG/ML IV SOLN  COMPARISON:  None.  FINDINGS: Normal alignment of the cervical vertebral bodies. They demonstrate normal marrow signal. The cervical spinal cord demonstrates ill-defined areas of increased T2 and STIR signal intensity. These are concerning for MS plaques. There is a small lesion in the right side of the cord at C4-5 and a larger left-sided cord lesion at C5-6. Suspect a vague lesion at the C2 level and also in the upper pons. No enhancement is identified after contrast administration.  No focal disc protrusions, spinal or foraminal stenosis.  IMPRESSION: MR findings suspicious for multiple sclerosis plaques involving the cervical spine. Lesions are noted at C4-5, C5-6 and also at the C2 level. An upper pons lesion is also noted. MRI of the brain may also be helpful for further evaluation of the remainder of the neural axis.   Electronically Signed   By: Loralie Champagne M.D.   On: 09/12/2013 16:42   Mr Thoracic Spine W Wo Contrast  09/12/2013   CLINICAL DATA:  Bilateral lower extremity numbness.  EXAM: MRI THORACIC SPINE  WITHOUT AND WITH CONTRAST  TECHNIQUE: Multiplanar and multiecho pulse sequences of the thoracic spine were obtained without and with intravenous contrast.  CONTRAST:  15mL MULTIHANCE GADOBENATE DIMEGLUMINE 529 MG/ML IV SOLN  COMPARISON:  None.  FINDINGS: The sagittal MRI images demonstrate normal alignment of the thoracic vertebral bodies. They demonstrate normal marrow signal. There is a mild left convex thoracic scoliosis. The thoracic spinal cord demonstrates multiple cord lesions with slight increased T2 and STIR signal intensity. Lesions are noted at T7, T7-8, T10 and T10-11. No definite enhancement postcontrast.  No significant thoracic disc protrusions, spinal or foraminal stenosis. There is a small left paracentral disc protrusion noted at T8-9 and a bulging annulus at T11-12.  IMPRESSION: Thoracic cord lesions suspicious for MS plaques as discussed above. No enhancement.   Electronically Signed   By: Loralie Champagne M.D.   On: 09/12/2013 16:48    MEDICATIONS                                                                                                                        Scheduled: . methylPREDNISolone (SOLU-MEDROL) injection  1,000 mg Intravenous Daily    ASSESSMENT/PLAN:  42 y/o female with new onset paresthesias involving left lateral middle thoracic-lumbar area, anterior left thigh, and genitalia area and MRI cervico-thoracic spine revealing multiple non enhancing cord lesions highly concerning for a demyelinating disorder.  MRI brain has been ordered but cannot be done until tomorrow due to recent use of dye. LP is being held until MRI brain obtained. If MRI brain is consistent with MS no need for LP. Will start Solumedrol 1 gram daily for 3 days and continue to follow.    Assessment and plan discussed with with attending physician and they are in agreement.    Felicie Morn  PA-C [facility] [phone]  09/13/2013, 1:06 PM

## 2013-09-13 NOTE — Progress Notes (Signed)
Received pt from ED. Pt alert and oriented without any signs of distress. Vital signs checked and recorded. Oriented pt to the room and the use of call bell. Will continue to monitor pt.

## 2013-09-14 ENCOUNTER — Inpatient Hospital Stay (HOSPITAL_COMMUNITY): Payer: BC Managed Care – PPO

## 2013-09-14 MED ORDER — SODIUM CHLORIDE 0.9 % IV SOLN: 1000.0000 mg | Freq: Every day | INTRAVENOUS | Status: DC

## 2013-09-14 MED ORDER — GADOBENATE DIMEGLUMINE 529 MG/ML IV SOLN
13.0000 mL | Freq: Once | INTRAVENOUS | Status: AC | PRN
  Administered 2013-09-14: 13 mL via INTRAVENOUS

## 2013-09-14 NOTE — Discharge Summary (Signed)
Physician Discharge Summary  Kelli Bright ZOX:[id number] DOB: Jan 25, 1971 DOA: 09/12/2013  PCP: No PCP Per Patient  Admit date: 09/12/2013 Discharge date: 09/14/2013  Time spent: 45 minutes  Recommendations for Outpatient Follow-up:  Patient will need to establish care with for [facility]. She is to do this within one week of discharge.  Patient will be seen by home health nurse on Sunday, 09/15/2013, she will receive one dose of IV Solu-Medrol. The IV is to be removed by the nurse once completed therapy.  Discharge Diagnoses:  Principal Problem:   Numbness and tingling   Multiple Sclerosis  Discharge Condition: Stable  Diet recommendation: Heart healthy  Filed Weights   09/12/13 1140 09/12/13 2034  Weight: 61.326 kg (135 lb 3.2 oz) 60.011 kg (132 lb 4.8 oz)    History of present illness:  Kelli Bright is a 42 y.o. female with no significant past medical history has been experiencing some numbness of the flank area over the last 4-5 days which has been gradually worsening. Patient denies any weakness of the upper extremities loss of consciousness or any blurred vision. Denies any difficulty speaking or swallowing or any incontinence of urine. 6 months ago patient had some difficulty seeing in the left eye for one or 2 hours and resolved spontaneously. Patient in addition the left flank numbness has mild numbness in the left anterior thigh. In the ER patient was seen by neurologist Dr. Cyril Mourning and MRI of the C-spine and T-spine were ordered which at this time shows features concerning for possible multiple sclerosis. Patient has been admitted for further management. Patient otherwise denies any chest pain shortness nausea vomiting abdominal pain. Patient has been having some chronic headaches.   Hospital Course:  This is a 42 year old female with no past medical history who presents emergency department complaining of numbness noted in her left flank, abdomen and, back,  anterior left thigh. This has been occurring for approximately one week prior to coming in. Patient did state that approximately 6 months ago patient had difficulty seeing with the left eye however it resolved spontaneously. MRI of the cervical and thoracic spine were conducted while patient was in emergency Department we should show some concerning features for multiple sclerosis. Patient was admitted and seen by neurology who ordered MRI of the brain, which showed extensive white matter changes bilaterally typical finding seen edema in process such as multiple sclerosis. Patient was started on IV Solu-Medrol 1 g daily. Neurology did ask for 3 doses to be given. Patient will receive her second dose on the hospital. She will receive one dose at home via home health RN on Sunday, 09/15/2013. Patient was given instructions on followup with [facility]. She is to call him on Monday to set up an appointment. Unable discharge patient was seen examined and found to be stable. She and her husband who is at bedside to understand and agree with the plan.  Procedures: None  Consultations: Neurology  Discharge Exam: Filed Vitals:   09/14/13 0538  BP: 118/75  Pulse: 82  Temp: 98.4 F (36.9 C)  Resp: 18   Exam  General: Well developed, well nourished, NAD, appears stated age  HEENT: NCAT, PERRLA, EOMI, Anicteic Sclera, mucous membranes moist.  Neck: Supple, no JVD, no masses  Cardiovascular: S1 S2 auscultated, no rubs, murmurs or gallops. Regular rate and rhythm.  Respiratory: Clear to auscultation bilaterally with equal chest rise  Abdomen: Soft, nontender, nondistended, + bowel sounds  Extremities: warm dry without cyanosis clubbing or edema  Neuro: AAOx3, cranial nerves grossly intact. Strength 5/5 in patient's upper and lower extremities bilaterally  Skin: Without rashes exudates or nodules  Psych: Normal affect and demeanor with intact judgement and insight  Discharge  Instructions  Discharge Orders   Future Orders Complete By Expires   Diet - low sodium heart healthy  As directed    Discharge instructions  As directed    Comments:     Patient will need to establish care with for [facility]. She is to do this within one week of discharge.  Patient will be seen by home health nurse on Sunday, 09/15/2013, she will receive one dose of IV Solu-Medrol. The IV is to be removed by the nurse once completed therapy.   Home Health  As directed    Scheduling Instructions:     Arthritis to give 1000 mg of Solu-Medrol to the patient on Sunday, 09/15/2013. IV is to be removed once completed.   Questions:     To provide the following care/treatments:  RN   Increase activity slowly  As directed        Medication List         CITRUCEL PO  Take 1 tablet by mouth daily as needed (for constipation).     ibuprofen 200 MG tablet  Commonly known as:  ADVIL,MOTRIN  Take 400 mg by mouth every 8 (eight) hours as needed for headache.     ORTHO TRI-CYCLEN (28) 0.18/0.215/0.25 MG-35 MCG tablet  Generic drug:  Norgestimate-Ethinyl Estradiol Triphasic  Take 1 tablet by mouth daily.     sodium chloride 0.9 % SOLN 50 mL with methylPREDNISolone sodium succinate 1000 MG SOLR 1,000 mg  Inject 1,000 mg into the vein daily.       No Known Allergies     Follow-up Information   Follow up with [facility]. Schedule an appointment as soon as possible for a visit in 1 week.   Specialty:  Neurology   Contact information:   [address] [place] [address] [phone]       The results of significant diagnostics from this hospitalization (including imaging, microbiology, ancillary and laboratory) are listed below for reference.    Significant Diagnostic Studies: Mr Lodema Pilot Contrast  10/10/2013   CLINICAL DATA:  Multiple sclerosis. Paresthesias. Abnormal MRI of the cervical and thoracic spine.  EXAM: MRI HEAD WITHOUT AND WITH  CONTRAST  TECHNIQUE: Multiplanar, multiecho pulse sequences of the brain and surrounding structures were obtained without and with intravenous contrast.  COMPARISON:  MRI of the cervical and thoracic spine without and with contrast 09/12/2013  FINDINGS: Extensive periventricular and subcortical T2 hyperintensities are present bilaterally. There is extensive involvement of the colossal septal margin. These findings are consistent with the suggested diagnosis of multiple sclerosis.  Subtle restricted diffusion is evident adjacent to the atrium of the right lateral ventricle on image 18 at of series 3 and posterior to the atrium of the left lateral ventricle, along the lateral margin of the corpus callosum.  There is a focus of enhancement in the anterior right frontal lobe on image 37 of series 11. No other significant enhancement is evident. These findings suggest areas of active demyelination.  No acute infarct or hemorrhage is present. There is no mass lesion. The midline structures are within normal limits.  Flow is present in the major intracranial arteries. The globes and orbits are intact. The paranasal sinuses and mastoid air cells are clear.  IMPRESSION: 1. Extensive white  matter changes bilaterally are typical of findings seen with a demyelinating process such as multiple sclerosis. 2. The 2 focal areas of periventricular restricted diffusion and at least 1 focal area of subcortical enhancement suggest active demyelination associated with white matter lesions.   Electronically Signed   By: Gennette Pac M.D.   On: 09/14/2013 10:26   Mr Cervical Spine W Wo Contrast  09/12/2013   CLINICAL DATA:  Extremity numbness.  EXAM: MRI CERVICAL SPINE WITHOUT AND WITH CONTRAST  TECHNIQUE: Multiplanar and multiecho pulse sequences of the cervical spine, to include the craniocervical junction and cervicothoracic junction, were obtained according to standard protocol without and with intravenous contrast.  CONTRAST:   15mL MULTIHANCE GADOBENATE DIMEGLUMINE 529 MG/ML IV SOLN  COMPARISON:  None.  FINDINGS: Normal alignment of the cervical vertebral bodies. They demonstrate normal marrow signal. The cervical spinal cord demonstrates ill-defined areas of increased T2 and STIR signal intensity. These are concerning for MS plaques. There is a small lesion in the right side of the cord at C4-5 and a larger left-sided cord lesion at C5-6. Suspect a vague lesion at the C2 level and also in the upper pons. No enhancement is identified after contrast administration.  No focal disc protrusions, spinal or foraminal stenosis.  IMPRESSION: MR findings suspicious for multiple sclerosis plaques involving the cervical spine. Lesions are noted at C4-5, C5-6 and also at the C2 level. An upper pons lesion is also noted. MRI of the brain may also be helpful for further evaluation of the remainder of the neural axis.   Electronically Signed   By: Loralie Champagne M.D.   On: 09/12/2013 16:42   Mr Thoracic Spine W Wo Contrast  09/12/2013   CLINICAL DATA:  Bilateral lower extremity numbness.  EXAM: MRI THORACIC SPINE WITHOUT AND WITH CONTRAST  TECHNIQUE: Multiplanar and multiecho pulse sequences of the thoracic spine were obtained without and with intravenous contrast.  CONTRAST:  15mL MULTIHANCE GADOBENATE DIMEGLUMINE 529 MG/ML IV SOLN  COMPARISON:  None.  FINDINGS: The sagittal MRI images demonstrate normal alignment of the thoracic vertebral bodies. They demonstrate normal marrow signal. There is a mild left convex thoracic scoliosis. The thoracic spinal cord demonstrates multiple cord lesions with slight increased T2 and STIR signal intensity. Lesions are noted at T7, T7-8, T10 and T10-11. No definite enhancement postcontrast.  No significant thoracic disc protrusions, spinal or foraminal stenosis. There is a small left paracentral disc protrusion noted at T8-9 and a bulging annulus at T11-12.  IMPRESSION: Thoracic cord lesions suspicious for MS  plaques as discussed above. No enhancement.   Electronically Signed   By: Loralie Champagne M.D.   On: 09/12/2013 16:48    Microbiology: No results found for this or any previous visit (from the past 240 hour(s)).   Labs: Basic Metabolic Panel:  Recent Labs Lab 09/12/13 1315 09/13/13 0555  NA 139 138  K 4.0 4.2  CL 105 104  CO2 24 24  GLUCOSE 84 85  BUN 8 8  CREATININE 0.71 0.76  CALCIUM 8.9 9.0   Liver Function Tests: No results found for this basename: AST, ALT, ALKPHOS, BILITOT, PROT, ALBUMIN,  in the last 168 hours No results found for this basename: LIPASE, AMYLASE,  in the last 168 hours No results found for this basename: AMMONIA,  in the last 168 hours CBC:  Recent Labs Lab 09/12/13 1315 09/13/13 0555  WBC 7.9 7.9  NEUTROABS 5.6  --   HGB 13.7 13.8  HCT 40.5 40.3  MCV  88.6 89.8  PLT 240 261   Cardiac Enzymes: No results found for this basename: CKTOTAL, CKMB, CKMBINDEX, TROPONINI,  in the last 168 hours BNP: BNP (last 3 results) No results found for this basename: PROBNP,  in the last 8760 hours CBG: No results found for this basename: GLUCAP,  in the last 168 hours     Signed:  Edsel Petrin  [facility] 09/14/2013, 11:21 AM\

## 2013-09-14 NOTE — Progress Notes (Signed)
   CARE MANAGEMENT NOTE 09/14/2013  Patient:  Kelli Bright, Kelli Bright   Account Number:  [id number]  Date Initiated:  09/14/2013  Documentation initiated by:  [facility]  Subjective/Objective Assessment:   adm: Numbness/Tingling of Left abdominal area/back/anterior thigh     Action/Plan:   dicharge planning   Anticipated DC Date:  09/14/2013   Anticipated DC Plan:  HOME W HOME HEALTH SERVICES      DC Planning Services  CM consult      Choice offered to / List presented to:          [facility] arranged  HH-1 RN      [facility] agency  [facility]   Status of service:  Completed, signed off Medicare Important Message given?   (If response is "NO", the following Medicare IM given date fields will be blank) Date Medicare IM given:   Date Additional Medicare IM given:    Discharge Disposition:  HOME W HOME HEALTH SERVICES  Per UR Regulation:    If discussed at Long Length of Stay Meetings, dates discussed:    Comments:  09/14/13 12:14 CM spoke to pt to verify address and contact number.  IV steroid run (solumedrol)  order and referral for [facility] faxed to [facility].  Pt will go home with peripheral IV to be dicontinued by RN after run of solumedrol.  No other CM needs were commnicated.  Freddy Jaksch, BSN, CM [phone].

## 2013-09-14 NOTE — Progress Notes (Signed)
NEURO HOSPITALIST PROGRESS NOTE   SUBJECTIVE:                                                                                                                        In good spirits. Husband at the bedside. Started on IV solumedrol yesterday. No new neurological complains. MRI brain with and without contrast showed extensive periventricular and subcortical T2 hyperintense lesions with 2 focal areas of periventricular restricted diffusion and at least 1 focal area of subcortical enhancement. In retrospect, she tells me that she recalls a prior episode of transient impairment of vision left eye and episodic numbness of the left hand.    OBJECTIVE:                                                                                                                           Vital signs in last 24 hours: Temp:  [98.1 F (36.7 C)-98.9 F (37.2 C)] 98.4 F (36.9 C) (11/08 0538) Pulse Rate:  [82-87] 82 (11/08 0538) Resp:  [18] 18 (11/08 0538) BP: (118-141)/(75-88) 118/75 mmHg (11/08 0538) SpO2:  [99 %-100 %] 99 % (11/08 0538)  Intake/Output from previous day: 11/07 0701 - 11/08 0700 In: 50 [IV Piggyback:50] Out: -  Intake/Output this shift:   Nutritional status: Vegetarian  Past Medical History  Diagnosis Date  . Medical history non-contributory    Neurologic Exam:  Alert, oriented, thought content appropriate. Speech fluent without evidence of aphasia. Able to follow 3 step commands without difficulty.  Cranial Nerves:  II: Discs flat bilaterally; Visual fields grossly normal, pupils equal, round, reactive to light and accommodation  III,IV, VI: ptosis not present, extra-ocular motions intact bilaterally  V,VII: smile symmetric, facial light touch sensation normal bilaterally  VIII: hearing normal bilaterally  IX,X: gag reflex present  XI: bilateral shoulder shrug  XII: midline tongue extension without atrophy or fasciculations  Motor:  Right :  Upper extremity 5/5 Left: Upper extremity 5/5  Lower extremity 5/5 Lower extremity 5/5  Tone and bulk:normal tone throughout; no atrophy noted  Sensory: continues to have decreased pinprick and light touch left lateral middle thoracic-lumbar area and anterior left thigh.  Deep Tendon Reflexes:  2+ bil UE 3+ bilateral bil. KJ and AJ  Plantars:  Right: downgoing Left: downgoing  Cerebellar:  normal finger-to-nose, normal heel-to-shin test  Gait:  No ataxia.  CV: pulses palpable throughout    Lab Results: No results found for this basename: cbc, bmp, coags, chol, tri, ldl, hga1c   Lipid Panel No results found for this basename: CHOL, TRIG, HDL, CHOLHDL, VLDL, LDLCALC,  in the last 72 hours  Studies/Results: Mr Laqueta Jean Wo Contrast  09/14/2013   CLINICAL DATA:  Multiple sclerosis. Paresthesias. Abnormal MRI of the cervical and thoracic spine.  EXAM: MRI HEAD WITHOUT AND WITH CONTRAST  TECHNIQUE: Multiplanar, multiecho pulse sequences of the brain and surrounding structures were obtained without and with intravenous contrast.  COMPARISON:  MRI of the cervical and thoracic spine without and with contrast 09/12/2013  FINDINGS: Extensive periventricular and subcortical T2 hyperintensities are present bilaterally. There is extensive involvement of the colossal septal margin. These findings are consistent with the suggested diagnosis of multiple sclerosis.  Subtle restricted diffusion is evident adjacent to the atrium of the right lateral ventricle on image 18 at of series 3 and posterior to the atrium of the left lateral ventricle, along the lateral margin of the corpus callosum.  There is a focus of enhancement in the anterior right frontal lobe on image 37 of series 11. No other significant enhancement is evident. These findings suggest areas of active demyelination.  No acute infarct or hemorrhage is present. There is no mass lesion. The midline structures are within normal limits.  Flow is present  in the major intracranial arteries. The globes and orbits are intact. The paranasal sinuses and mastoid air cells are clear.  IMPRESSION: 1. Extensive white matter changes bilaterally are typical of findings seen with a demyelinating process such as multiple sclerosis. 2. The 2 focal areas of periventricular restricted diffusion and at least 1 focal area of subcortical enhancement suggest active demyelination associated with white matter lesions.   Electronically Signed   By: Gennette Pac M.D.   On: 09/14/2013 10:26   Mr Cervical Spine W Wo Contrast  09/12/2013   CLINICAL DATA:  Extremity numbness.  EXAM: MRI CERVICAL SPINE WITHOUT AND WITH CONTRAST  TECHNIQUE: Multiplanar and multiecho pulse sequences of the cervical spine, to include the craniocervical junction and cervicothoracic junction, were obtained according to standard protocol without and with intravenous contrast.  CONTRAST:  15mL MULTIHANCE GADOBENATE DIMEGLUMINE 529 MG/ML IV SOLN  COMPARISON:  None.  FINDINGS: Normal alignment of the cervical vertebral bodies. They demonstrate normal marrow signal. The cervical spinal cord demonstrates ill-defined areas of increased T2 and STIR signal intensity. These are concerning for MS plaques. There is a small lesion in the right side of the cord at C4-5 and a larger left-sided cord lesion at C5-6. Suspect a vague lesion at the C2 level and also in the upper pons. No enhancement is identified after contrast administration.  No focal disc protrusions, spinal or foraminal stenosis.  IMPRESSION: MR findings suspicious for multiple sclerosis plaques involving the cervical spine. Lesions are noted at C4-5, C5-6 and also at the C2 level. An upper pons lesion is also noted. MRI of the brain may also be helpful for further evaluation of the remainder of the neural axis.   Electronically Signed   By: Loralie Champagne M.D.   On: 09/12/2013 16:42   Mr Thoracic Spine W Wo Contrast  09/12/2013   CLINICAL DATA:  Bilateral  lower extremity numbness.  EXAM: MRI THORACIC SPINE WITHOUT AND WITH CONTRAST  TECHNIQUE: Multiplanar and multiecho pulse sequences of  the thoracic spine were obtained without and with intravenous contrast.  CONTRAST:  15mL MULTIHANCE GADOBENATE DIMEGLUMINE 529 MG/ML IV SOLN  COMPARISON:  None.  FINDINGS: The sagittal MRI images demonstrate normal alignment of the thoracic vertebral bodies. They demonstrate normal marrow signal. There is a mild left convex thoracic scoliosis. The thoracic spinal cord demonstrates multiple cord lesions with slight increased T2 and STIR signal intensity. Lesions are noted at T7, T7-8, T10 and T10-11. No definite enhancement postcontrast.  No significant thoracic disc protrusions, spinal or foraminal stenosis. There is a small left paracentral disc protrusion noted at T8-9 and a bulging annulus at T11-12.  IMPRESSION: Thoracic cord lesions suspicious for MS plaques as discussed above. No enhancement.   Electronically Signed   By: Loralie Champagne M.D.   On: 09/12/2013 16:48    MEDICATIONS                                                                                                                       I have reviewed the patient's current medications.  ASSESSMENT/PLAN:                                                                                                           CIS/MS.  MRI brain findings discussed at length with patient and wife. I think her clinical presentation and neuro-imaging brain and spinal cord are very suggestive of a demyelinating process and thus will not pursue spinal tap at this moment. Her serologies negative RPR, negative SSA-SSB antibodies, negative lyme titers, vitamin D and vitamin B12 pending.   She will get her second dose of IV solumedrol today and the last dose by home health services at home tomorrow, as she wants to be release from the hospital today. She needs to be seen by neurology as outpatient the sooner the better.  Wyatt Portela, MD        Wyatt Portela, MD [facility] [phone]  09/14/2013, 11:55 AM

## 2013-09-14 NOTE — Progress Notes (Signed)
Pt. DC home by car with spouse.  [facility] has been set up.  DC instructions given and understood by patient.  Pt. Assessments stable.

## 2013-09-16 LAB — METHYLMALONIC ACID, SERUM: Methylmalonic Acid, Quantitative: 0.2 umol/L (ref <0.40)

## 2013-09-16 NOTE — ED Provider Notes (Signed)
Medical screening examination/treatment/procedure(s) were conducted as a shared visit with non-physician practitioner(s) and myself.  I personally evaluated the patient during the encounter.  EKG Interpretation     Ventricular Rate:  94 PR Interval:  134 QRS Duration: 82 QT Interval:  336 QTC Calculation: 420 R Axis:   73 Text Interpretation:  Normal sinus rhythm with sinus arrhythmia Normal ECG             Patient here with numbness to left trunk extending down into L buttock. No crossing the midline, deficits cover multiple dermatomes, too many to be shingles. Can feel light touch, just an altered feeling. Neuro consulted and recommended MRIs.   Dagmar Hait, MD 09/16/13 [phone]

## 2017-02-09 ENCOUNTER — Other Ambulatory Visit: Payer: Self-pay | Admitting: Obstetrics and Gynecology

## 2017-02-09 DIAGNOSIS — R928 Other abnormal and inconclusive findings on diagnostic imaging of breast: Secondary | ICD-10-CM

## 2017-02-13 ENCOUNTER — Ambulatory Visit
Admission: RE | Admit: 2017-02-13 | Discharge: 2017-02-13 | Disposition: A | Discharge status: Home / Self Care | Payer: BLUE CROSS/BLUE SHIELD | Source: Ambulatory Visit | Attending: Obstetrics and Gynecology | Admitting: Obstetrics and Gynecology

## 2017-02-13 DIAGNOSIS — R928 Other abnormal and inconclusive findings on diagnostic imaging of breast: Secondary | ICD-10-CM

## 2018-02-20 LAB — HM PAP SMEAR: HM Pap smear: NEGATIVE

## 2018-06-07 IMAGING — MG 2D DIGITAL DIAGNOSTIC UNILATERAL LEFT MAMMOGRAM WITH CAD AND ADJ
9 series · 9 of 21 positions shown · non-contrast
Comparison: February 07, 2017 and earlier priors

CLINICAL DATA: Possible mass identified in the outer left breast on
recent 2D screening mammogram.

EXAM:
2D DIGITAL DIAGNOSTIC UNILATERAL LEFT MAMMOGRAM WITH CAD AND ADJUNCT
TOMO

[L MLO]
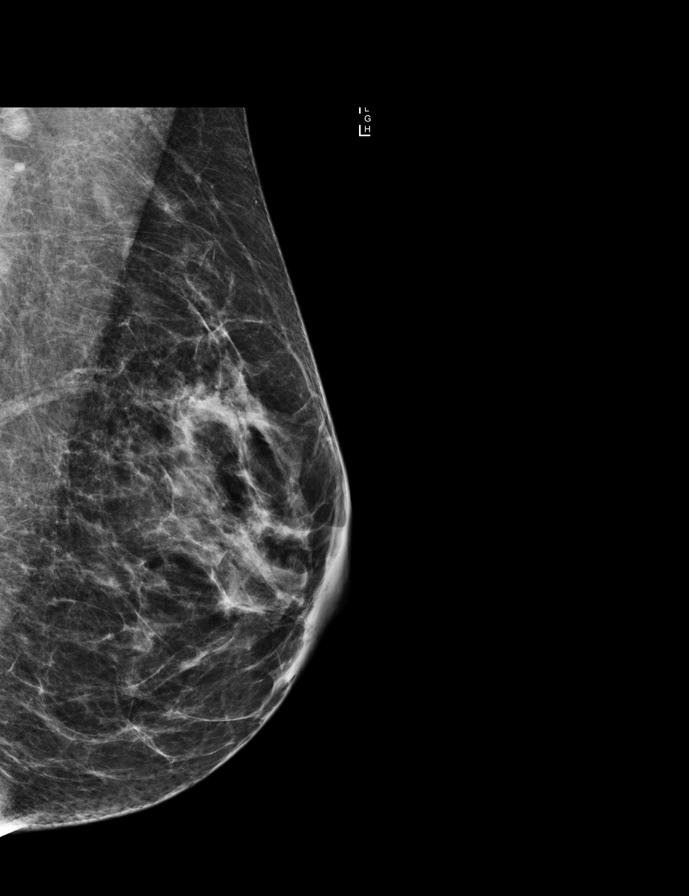

[L CC (1 of 2)]
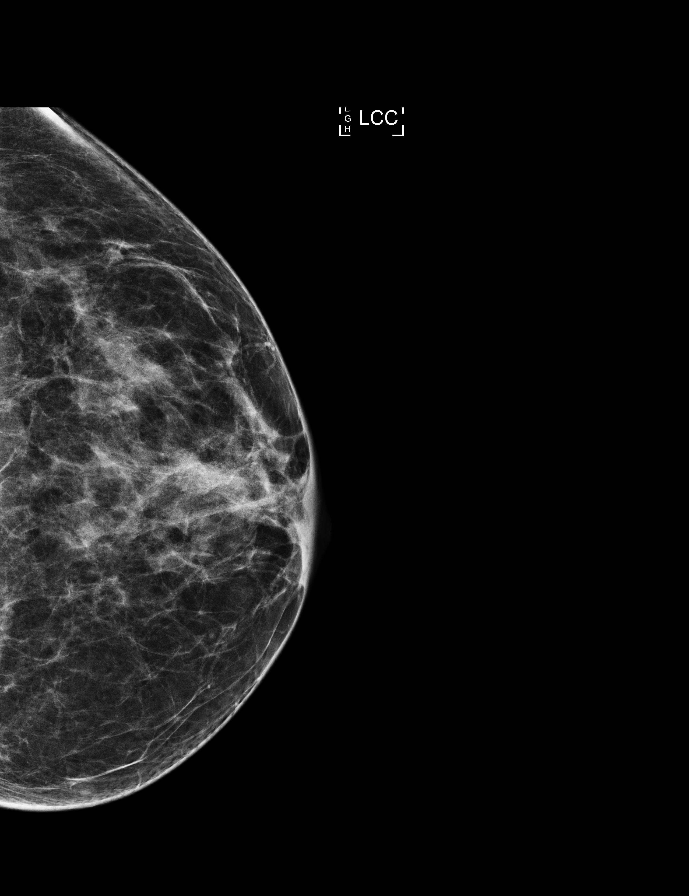

[L MLO synth-2D]
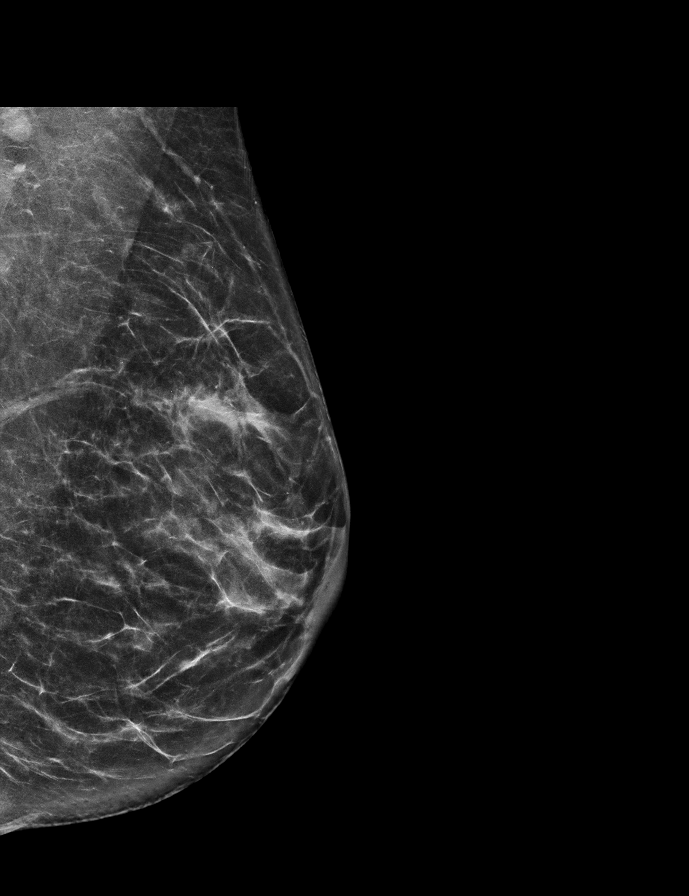

[L CC (2 of 2)]
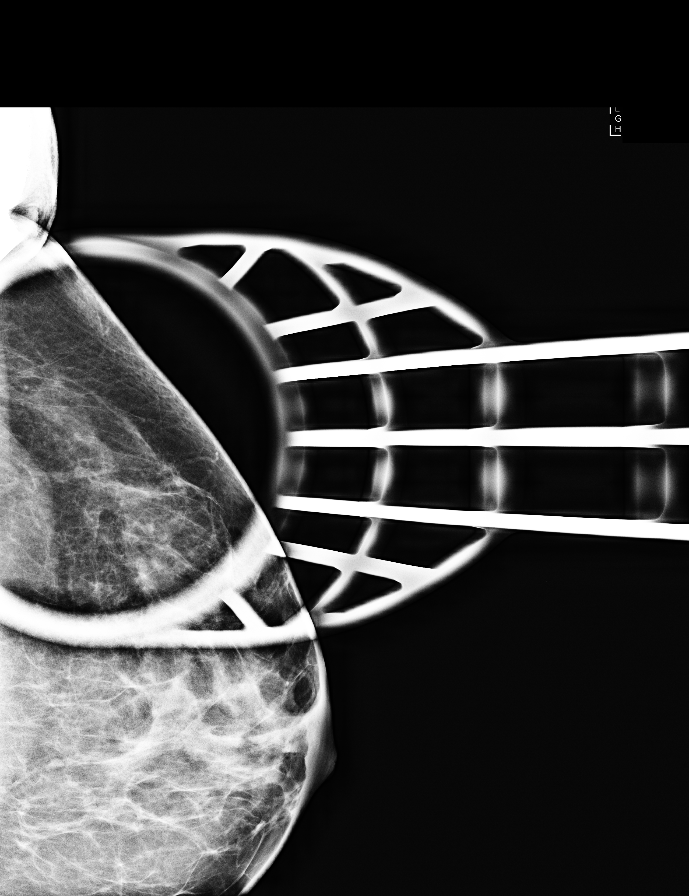

[L CC synth-2D (1 of 2)]
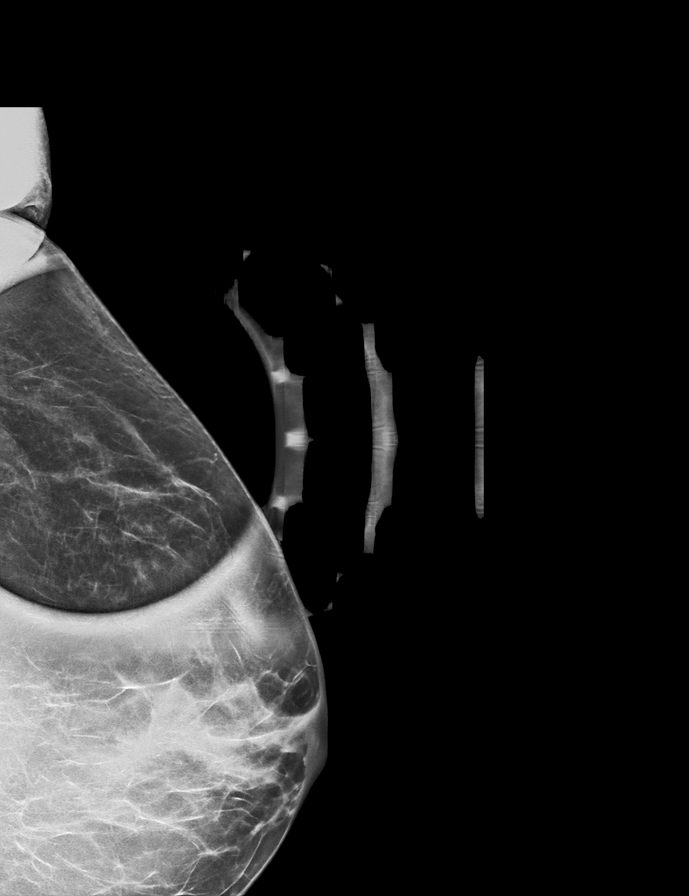

[L CC synth-2D (2 of 2)]
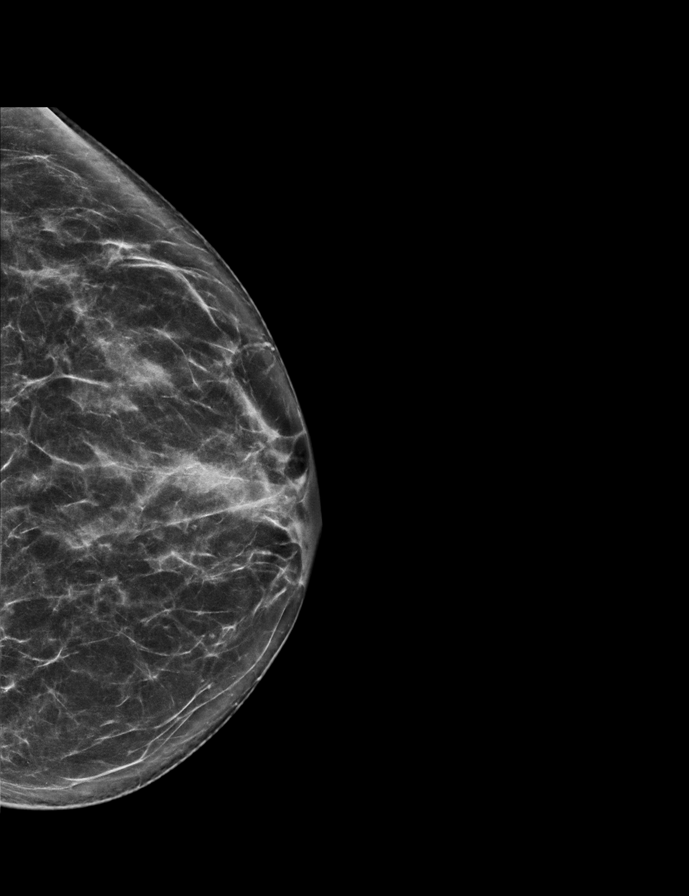

[L MLO tomo · tomo slice 36/71.0]
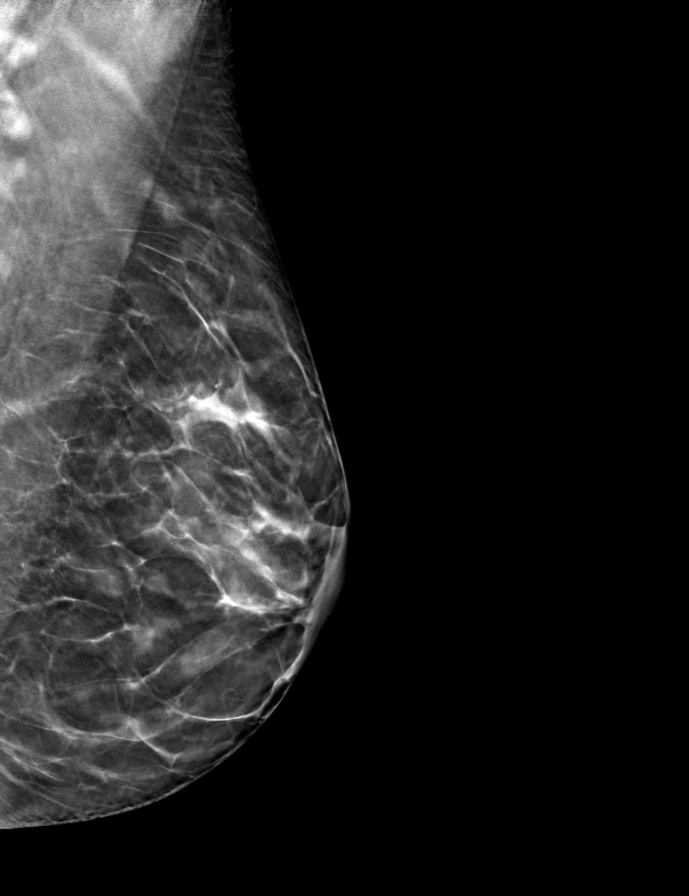

[L CC tomo (1 of 2) · tomo slice 27/52.0]
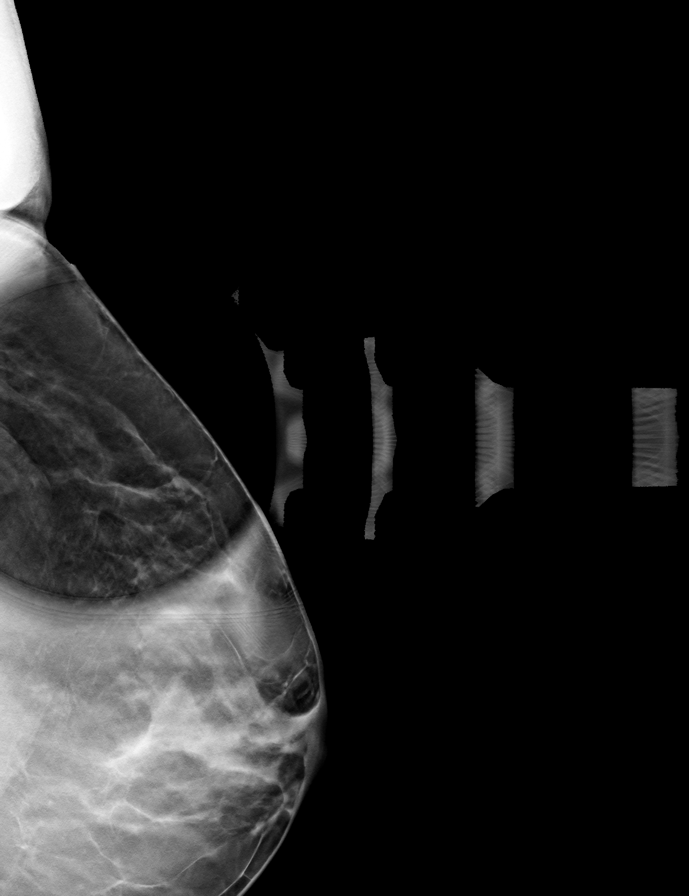

[L CC tomo (2 of 2) · tomo slice 34/67.0]
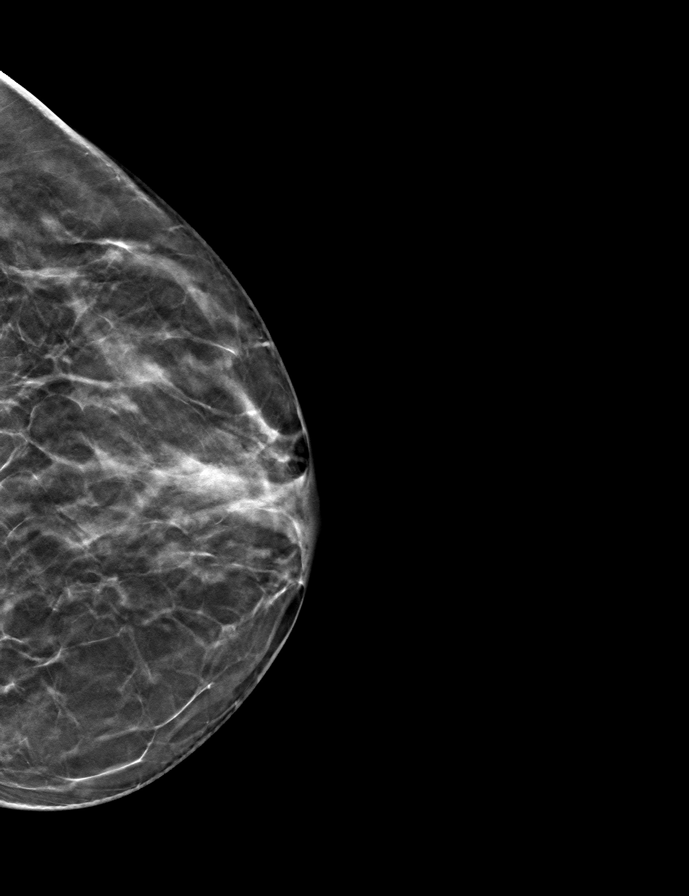

[9 of 21 positions shown; findings below may reference images not displayed]

ACR Breast Density Category b: There are scattered areas of
fibroglandular density.
FINDINGS: Whole breast CC and MLO views including tomography and a focal spot
compression view of the outer left breast show no persistent mass.
The area of concern on the screening mammogram is consistent with
overlap of normal fibroglandular tissue. There is no distortion in
the left breast.

Mammographic images were processed with CAD.
IMPRESSION: No evidence of malignancy in the left breast.

RECOMMENDATION:
New Screening mammogram in one year.(Code:[id number])

I have discussed the findings and recommendations with the patient.
Results were also provided in writing at the conclusion of the
visit. If applicable, a reminder letter will be sent to the patient
regarding the next appointment.

BI-RADS CATEGORY  1: Negative.

## 2018-09-21 DIAGNOSIS — M7501 Adhesive capsulitis of right shoulder: Secondary | ICD-10-CM | POA: Insufficient documentation

## 2019-10-10 DIAGNOSIS — G43909 Migraine, unspecified, not intractable, without status migrainosus: Secondary | ICD-10-CM | POA: Insufficient documentation

## 2019-11-11 DIAGNOSIS — I10 Essential (primary) hypertension: Secondary | ICD-10-CM | POA: Insufficient documentation

## 2020-11-16 ENCOUNTER — Ambulatory Visit: Payer: BLUE CROSS/BLUE SHIELD | Admitting: Family Medicine

## 2020-12-21 ENCOUNTER — Other Ambulatory Visit: Payer: Self-pay

## 2020-12-22 ENCOUNTER — Encounter: Payer: Self-pay | Admitting: Family Medicine

## 2020-12-22 ENCOUNTER — Ambulatory Visit (INDEPENDENT_AMBULATORY_CARE_PROVIDER_SITE_OTHER): Payer: BC Managed Care – PPO | Admitting: Family Medicine

## 2020-12-22 VITALS — BP 138/80 | HR 110 | Temp 97.8°F | Ht 61.0 in | Wt 129.2 lb

## 2020-12-22 DIAGNOSIS — G35 Multiple sclerosis: Secondary | ICD-10-CM | POA: Diagnosis not present

## 2020-12-22 DIAGNOSIS — M722 Plantar fascial fibromatosis: Secondary | ICD-10-CM | POA: Diagnosis not present

## 2020-12-22 DIAGNOSIS — I10 Essential (primary) hypertension: Secondary | ICD-10-CM

## 2020-12-22 DIAGNOSIS — E785 Hyperlipidemia, unspecified: Secondary | ICD-10-CM | POA: Insufficient documentation

## 2020-12-22 DIAGNOSIS — M25551 Pain in right hip: Secondary | ICD-10-CM | POA: Diagnosis not present

## 2020-12-22 DIAGNOSIS — M25552 Pain in left hip: Secondary | ICD-10-CM

## 2020-12-22 NOTE — Patient Instructions (Signed)
Plantar Fasciitis  Plantar fasciitis is a painful foot condition that affects the heel. It occurs when the band of tissue that connects the toes to the heel bone (plantar fascia) becomes irritated. This can happen as the result of exercising too much or doing other repetitive activities (overuse injury). Plantar fasciitis can cause mild irritation to severe pain that makes it difficult to walk or move. The pain is usually worse in the morning after sleeping, or after sitting or lying down for a period of time. Pain may also be worse after long periods of walking or standing. What are the causes? This condition may be caused by:  Standing for long periods of time.  Wearing shoes that do not have good arch support.  Doing activities that put stress on joints (high-impact activities). This includes ballet and exercise that makes your heart beat faster (aerobic exercise), such as running.  Being overweight.  An abnormal way of walking (gait).  Tight muscles in the back of your lower leg (calf).  High arches in your feet or flat feet.  Starting a new athletic activity. What are the signs or symptoms? The main symptom of this condition is heel pain. Pain may get worse after the following:  Taking the first steps after a time of rest, especially in the morning after awakening, or after you have been sitting or lying down for a while.  Long periods of standing still. Pain may decrease after 30-45 minutes of activity, such as gentle walking. How is this diagnosed? This condition may be diagnosed based on your medical history, a physical exam, and your symptoms. Your health care provider will check for:  A tender area on the bottom of your foot.  A high arch in your foot or flat feet.  Pain when you move your foot.  Difficulty moving your foot. You may have imaging tests to confirm the diagnosis, such as:  X-rays.  Ultrasound.  MRI. How is this treated? Treatment for plantar  fasciitis depends on how severe your condition is. Treatment may include:  Rest, ice, pressure (compression), and raising (elevating) the affected foot. This is called RICE therapy. Your health care provider may recommend RICE therapy along with over-the-counter pain medicines to manage your pain.  Exercises to stretch your calves and your plantar fascia.  A splint that holds your foot in a stretched, upward position while you sleep (night splint).  Physical therapy to relieve symptoms and prevent problems in the future.  Injections of steroid medicine (cortisone) to relieve pain and inflammation.  Stimulating your plantar fascia with electrical impulses (extracorporeal shock wave therapy). This is usually the last treatment option before surgery.  Surgery, if other treatments have not worked after 12 months. Follow these instructions at home: Managing pain, stiffness, and swelling  If directed, put ice on the painful area. To do this: ? Put ice in a plastic bag, or use a frozen bottle of water. ? Place a towel between your skin and the bag or bottle. ? Roll the bottom of your foot over the bag or bottle. ? Do this for 20 minutes, 2-3 times a day.  Wear athletic shoes that have air-sole or gel-sole cushions, or try soft shoe inserts that are designed for plantar fasciitis.  Elevate your foot above the level of your heart while you are sitting or lying down.   Activity  Avoid activities that cause pain. Ask your health care provider what activities are safe for you.  Do physical therapy exercises   and stretches as told by your health care provider.  Try activities and forms of exercise that are easier on your joints (low impact). Examples include swimming, water aerobics, and biking. General instructions  Take over-the-counter and prescription medicines only as told by your health care provider.  Wear a night splint while sleeping, if told by your health care provider. Loosen the  splint if your toes tingle, become numb, or turn cold and blue.  Maintain a healthy weight, or work with your health care provider to lose weight as needed.  Keep all follow-up visits. This is important. Contact a health care provider if you have:  Symptoms that do not go away with home treatment.  Pain that gets worse.  Pain that affects your ability to move or do daily activities. Summary  Plantar fasciitis is a painful foot condition that affects the heel. It occurs when the band of tissue that connects the toes to the heel bone (plantar fascia) becomes irritated.  Heel pain is the main symptom of this condition. It may get worse after exercising too much or standing still for a long time.  Treatment varies, but it usually starts with rest, ice, pressure (compression), and raising (elevating) the affected foot. This is called RICE therapy. Over-the-counter medicines can also be used to manage pain. This information is not intended to replace advice given to you by your health care provider. Make sure you discuss any questions you have with your health care provider. Document Revised: 02/10/2020 Document Reviewed: 02/10/2020 Elsevier Patient Education  2021 Elsevier Inc.  

## 2020-12-22 NOTE — Progress Notes (Signed)
[facility] [address] [place] [address] Dept: [phone] Dept Fax: [phone]  New Patient Office Visit  Subjective:    Patient ID: Vita Barley, female    DOB: [birth year], 50 y.o..   MRN: [id number]  Chief Complaint  Patient presents with  . Establish Care    New patient, establish care. No concerns patient fasting for labs.     History of Present Illness:  Patient is in today to establish care. She and her husband had previously been splitting time between [place] and [employer]. 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  Ms. Market has a history of migraines, primarily menstrual-related. These only very occasionally give her problems.  She has a history of mild hypertension, treated with low dose amlodipine. There is afamily history of HTN. Her father died in his 67s of an MI. She is a former smoker, but quit 15 years ago.  Ms. Brandel was diagnosed at age 56 with multiple sclerosis. She was having left lateral thigh numbness. An MRI scan at the time showed the evidence of MS. She is currently managed on diroximel. She notes she is mostly in remission, with only minor issues that she has learned to deal with. She takes a B12 supplement on her own, out of concern for making sure that she is not deficient, and therefore impacting normal myelinization.  Ms. Birt has perennial allergies. She uses Claritin. She wonders if this might be due to dog dander, but is not willing to no longer have her dog.  Ms. Weisner notes a 1 month history of left heel pain. This started after she had gone on a hike. Early on, she did have worse pain 1st thing in the morning, but that has been improving. Her mother had a history of "heel spurs".  Ms. Neu also notes a gradual worsening of bilateral hip pain, esp. With walking longer distances.  Past Medical History: Patient Active Problem List    Diagnosis Date Noted  . Essential hypertension 11/11/2019  . Migraine 10/10/2019  . Adhesive capsulitis of right shoulder 09/21/2018  . Numbness and tingling 09/12/2013  . Multiple sclerosis ([place]) 09/07/2013   Past Surgical History:  Procedure Laterality Date  . NO PAST SURGERIES     Family History  Problem Relation Age of Onset  . Hypertension Mother   . Skin cancer Mother   . CAD Father   . Hypertension Father   . Skin cancer Father    Outpatient Medications Prior to Visit  Medication Sig Dispense Refill  . amLODipine (NORVASC) 2.5 MG tablet Take 2.5 mg by mouth daily.    Marland Kitchen CRANBERRY EXTRACT PO Take 2 tablets by mouth daily.    . cyclobenzaprine (FLEXERIL) 10 MG tablet cyclobenzaprine 10 mg tablet    . diphenhydrAMINE (BENADRYL) 25 mg capsule Benadryl 25 mg capsule  Take 2 capsules every 4 hours by oral route.    . Diroximel Fumarate, Starter, (VUMERITY, STARTER,) 231 MG CPDR Vumerity 231 mg capsule,delayed release  Take 2 capsules twice a day by oral route.    Marland Kitchen ibuprofen (ADVIL,MOTRIN) 200 MG tablet Take 400 mg by mouth every 8 (eight) hours as needed for headache.     . loratadine (CLARITIN) 10 MG tablet Take by mouth.    . Magnesium 250 MG TABS Take by mouth.    . Methylcellulose, Laxative, (CITRUCEL PO) Take 1 tablet by mouth daily as needed (for constipation).    Marland Kitchen  Multiple Vitamin (MULTI-VITAMIN) tablet Take by mouth.    . Norgestimate-Ethinyl Estradiol Triphasic 0.18/0.215/0.25 MG-35 MCG tablet Take 1 tablet by mouth daily.    . vitamin B-12 (CYANOCOBALAMIN) 250 MCG tablet Take 1 tablet by mouth daily.    . sodium chloride 0.9 % SOLN 50 mL with methylPREDNISolone sodium succinate 1000 MG SOLR 1,000 mg Inject 1,000 mg into the vein daily. (Patient not taking: Reported on 12/22/2020) 1000 mg 0   No facility-administered medications prior to visit.   No Known Allergies    Objective:   Today's Vitals   12/22/20 0955  BP: 138/80  Pulse: (!) 110  Temp: 97.8 F  (36.6 C)  TempSrc: Temporal  SpO2: 97%  Weight: 129 lb 3.2 oz (58.6 kg)  Height: 5\' 1"  (1.549 m)   Body mass index is 24.41 kg/m.   General: Well developed, well nourished. No acute distress. Extremities: There is tenderness noted with direct pressure over the left heel at the planta fascial attachment. Tilt  and drawer testing of the ankle is normal and there is no swelling. Psych: Alert and oriented. Normal mood and affect.  Health Maintenance Due  Topic Date Due  . Hepatitis C Screening  Never done  . HIV Screening  Never done  . PAP SMEAR-Modifier  Never done  . COLONOSCOPY (Pts 45-76yrs Insurance coverage will need to be confirmed)  Never done      Assessment & Plan:   1. Essential hypertension BP at goal. Continue amlodipine. Recheck in 3 months.  2. Multiple sclerosis ([place]) Stable on Vumerity. Continue to follow with neurology.  3. Plantar fasciitis of left foot Discussed pathogenesis and natural history of plantar fasciitis. I recommended heel cord and plantar fascia stretches (printed instructions given). She may also try ibuprofen to see if this will improve. If not improving with time, consider podiatry referral.  4. Hip pain, bilateral She plans to see a local sports medicine physician to assess.  Haydee Salter, MD

## 2020-12-30 ENCOUNTER — Ambulatory Visit (INDEPENDENT_AMBULATORY_CARE_PROVIDER_SITE_OTHER): Payer: BC Managed Care – PPO | Admitting: Family Medicine

## 2020-12-30 ENCOUNTER — Other Ambulatory Visit: Payer: Self-pay

## 2020-12-30 VITALS — BP 124/86 | Ht 60.0 in | Wt 124.0 lb

## 2020-12-30 DIAGNOSIS — M722 Plantar fascial fibromatosis: Secondary | ICD-10-CM | POA: Diagnosis not present

## 2020-12-30 DIAGNOSIS — M25551 Pain in right hip: Secondary | ICD-10-CM

## 2020-12-30 DIAGNOSIS — M25552 Pain in left hip: Secondary | ICD-10-CM

## 2020-12-30 NOTE — Patient Instructions (Signed)
You have IT band syndrome Ice over area of pain 3-4 times a day for 15 minutes at a time Hip side raise exercise 3 sets of 10 once a day - add weights if this becomes too easy. Stretches - pick 2-3 and hold for 20-30 seconds x 3 - do once or twice a day. If not improving, can consider physical therapy and/or steroid injection. Follow up with me in 6 weeks.  You have plantar fasciitis Take tylenol and/or aleve as needed for pain if you can take these. Plantar fascia stretch for 20-30 seconds (do 3 of these) in morning Lowering/raise on a step exercises 3 x 10 once or twice a day - this is very important for long term recovery. Can add heel walks, toe walks forward and backward as well Ice heel for 15 minutes as needed. Avoid flat shoes/barefoot walking as much as possible. Arch straps have been shown to help with pain. Inserts are important (dr. Zoe Lan active series, spencos, our green insoles, custom orthotics). Steroid injection is a consideration for short term pain relief if you are struggling. Physical therapy is also an option. Follow up with me in 6 weeks.

## 2020-12-31 ENCOUNTER — Ambulatory Visit: Payer: BLUE CROSS/BLUE SHIELD | Admitting: Family Medicine

## 2020-12-31 ENCOUNTER — Encounter: Payer: Self-pay | Admitting: Family Medicine

## 2020-12-31 NOTE — Progress Notes (Signed)
PCP: Haydee Salter, MD  Subjective:   HPI: Patient is a 50 y.o. female here for bilateral hip, left foot pain.  Patient reports for several months she's had lateral hip pain with left being a little worse. Bothers more when she gets up from a seated position. Worse when she lied down on a hard mattress (much better with her memory foam mattress). Also bothered her more when she was in the yard after raking leaves. No radiation down legs. No numbness. Left foot/heel plantar pain bothering her since January. Enjoys walking for exercise. Doing stretches and rolling foot on an iced water bottle.  Past Medical History:  Diagnosis Date  . Hypertension   . Medical history non-contributory   . MS (congenital mitral stenosis)     Current Outpatient Medications on File Prior to Visit  Medication Sig Dispense Refill  . amLODipine (NORVASC) 2.5 MG tablet Take 2.5 mg by mouth daily.    Marland Kitchen CRANBERRY EXTRACT PO Take 2 tablets by mouth daily.    . cyclobenzaprine (FLEXERIL) 10 MG tablet cyclobenzaprine 10 mg tablet    . diphenhydrAMINE (BENADRYL) 25 mg capsule Benadryl 25 mg capsule  Take 2 capsules every 4 hours by oral route.    . Diroximel Fumarate, Starter, (VUMERITY, STARTER,) 231 MG CPDR Vumerity 231 mg capsule,delayed release  Take 2 capsules twice a day by oral route.    Marland Kitchen ibuprofen (ADVIL,MOTRIN) 200 MG tablet Take 400 mg by mouth every 8 (eight) hours as needed for headache.     . loratadine (CLARITIN) 10 MG tablet Take by mouth.    . Magnesium 250 MG TABS Take by mouth.    . Methylcellulose, Laxative, (CITRUCEL PO) Take 1 tablet by mouth daily as needed (for constipation).    . Multiple Vitamin (MULTI-VITAMIN) tablet Take by mouth.    . Norgestimate-Ethinyl Estradiol Triphasic 0.18/0.215/0.25 MG-35 MCG tablet Take 1 tablet by mouth daily.    . vitamin B-12 (CYANOCOBALAMIN) 250 MCG tablet Take 1 tablet by mouth daily.     No current facility-administered medications on file prior  to visit.    Past Surgical History:  Procedure Laterality Date  . NO PAST SURGERIES      Allergies  Allergen Reactions  . Hydrogen Peroxide Other (See Comments) and Swelling    Social History   Socioeconomic History  . Marital status: Married    Spouse name: Not on file  . Number of children: Not on file  . Years of education: Not on file  . Highest education level: Not on file  Occupational History  . Not on file  Tobacco Use  . Smoking status: Former Smoker    Types: Cigarettes    Quit date: 08/16/2006    Years since quitting: 14.3  . Smokeless tobacco: Never Used  Vaping Use  . Vaping Use: Never used  Substance and Sexual Activity  . Alcohol use: Yes    Alcohol/week: 1.0 standard drink    Types: 1 Shots of liquor per week    Comment: social  . Drug use: No  . Sexual activity: Yes  Other Topics Concern  . Not on file  Social History Narrative  . Not on file   Social Determinants of Health   Financial Resource Strain: Not on file  Food Insecurity: Not on file  Transportation Needs: Not on file  Physical Activity: Not on file  Stress: Not on file  Social Connections: Not on file  Intimate Partner Violence: Not on file  Family History  Problem Relation Age of Onset  . Hypertension Mother   . Skin cancer Mother   . CAD Father   . Hypertension Father   . Skin cancer Father     BP 124/86   Ht 5' (1.524 m)   Wt 124 lb (56.2 kg)   BMI 24.22 kg/m   No flowsheet data found.  No flowsheet data found.  Review of Systems: See HPI above.     Objective:  Physical Exam:  Gen: NAD, comfortable in exam room  Right hip: No deformity. FROM with 5/5 strength including hip abduction. Tenderness to palpation proximal IT band especially over greater trochanter. NVI distally. Negative logroll, fader, fabir. Positive ober.  Left hip: No deformity. FROM with 5/5 strength including hip abduction. Tenderness to palpation proximal IT band especially  over greater trochanter. NVI distally. Negative logroll, fader, fabir. Positive ober.  Left foot/ankle: Mod pronation.  No other gross deformity, swelling, ecchymoses FROM TTP medial plantar fascia at calcaneal insertion negative ant drawer and negative talar tilt.   Negative syndesmotic compression, calcaneal squeeze. Thompsons test negative. NV intact distally.   Assessment & Plan:  1. Bilateral hip pain - 2/2 proximal IT band syndrome.  Icing, stretches and strengthening exercises reviewed.  Consider formal PT, injections if not improving.  F/u in 6 weeks.  2. Left plantar fasciitis - arch supports, home exercises and stretches, arch binder.  Icing.  Consider injection if significantly worsens.  F/u in 6 weeks.

## 2021-03-01 ENCOUNTER — Encounter: Payer: Self-pay | Admitting: Family Medicine

## 2021-03-01 ENCOUNTER — Ambulatory Visit (INDEPENDENT_AMBULATORY_CARE_PROVIDER_SITE_OTHER): Payer: BC Managed Care – PPO | Admitting: Family Medicine

## 2021-03-01 ENCOUNTER — Other Ambulatory Visit: Payer: Self-pay

## 2021-03-01 VITALS — BP 100/72 | Ht 60.0 in | Wt 125.0 lb

## 2021-03-01 DIAGNOSIS — M25552 Pain in left hip: Secondary | ICD-10-CM

## 2021-03-01 DIAGNOSIS — M25551 Pain in right hip: Secondary | ICD-10-CM | POA: Diagnosis not present

## 2021-03-01 DIAGNOSIS — M722 Plantar fascial fibromatosis: Secondary | ICD-10-CM

## 2021-03-01 DIAGNOSIS — M7630 Iliotibial band syndrome, unspecified leg: Secondary | ICD-10-CM | POA: Insufficient documentation

## 2021-03-01 NOTE — Patient Instructions (Signed)
You're doing great! Continue the home exercises as we discussed. Also ease back into your impact activities and hopefully Zumba is not far away! Call or message me if you have any issues otherwise follow up as needed.

## 2021-03-01 NOTE — Progress Notes (Signed)
PCP: Haydee Salter, MD  Subjective:   HPI: Patient is a 50 y.o. female here for bilateral hip, left foot pain.  2/23: Patient reports for several months she's had lateral hip pain with left being a little worse. Bothers more when she gets up from a seated position. Worse when she lied down on a hard mattress (much better with her memory foam mattress). Also bothered her more when she was in the yard after raking leaves. No radiation down legs. No numbness. Left foot/heel plantar pain bothering her since January. Enjoys walking for exercise. Doing stretches and rolling foot on an iced water bottle.  4/25: Patient reports she's doing well. Right hip is completely improved, left foot about 50%, left hip 30%. She has been doing home exercises. Not taking any medications or icing - not been bad enough to need either. No new injuries.  Past Medical History:  Diagnosis Date  . Hypertension   . Medical history non-contributory   . MS (congenital mitral stenosis)     Current Outpatient Medications on File Prior to Visit  Medication Sig Dispense Refill  . amLODipine (NORVASC) 2.5 MG tablet Take 2.5 mg by mouth daily.    Marland Kitchen CRANBERRY EXTRACT PO Take 2 tablets by mouth daily.    . cyclobenzaprine (FLEXERIL) 10 MG tablet cyclobenzaprine 10 mg tablet    . diphenhydrAMINE (BENADRYL) 25 mg capsule Benadryl 25 mg capsule  Take 2 capsules every 4 hours by oral route.    . Diroximel Fumarate, Starter, (VUMERITY, STARTER,) 231 MG CPDR Vumerity 231 mg capsule,delayed release  Take 2 capsules twice a day by oral route.    Marland Kitchen ibuprofen (ADVIL,MOTRIN) 200 MG tablet Take 400 mg by mouth every 8 (eight) hours as needed for headache.     . loratadine (CLARITIN) 10 MG tablet Take by mouth.    . Magnesium 250 MG TABS Take by mouth.    . Methylcellulose, Laxative, (CITRUCEL PO) Take 1 tablet by mouth daily as needed (for constipation).    . Multiple Vitamin (MULTI-VITAMIN) tablet Take by mouth.    .  Norgestimate-Ethinyl Estradiol Triphasic 0.18/0.215/0.25 MG-35 MCG tablet Take 1 tablet by mouth daily.    . vitamin B-12 (CYANOCOBALAMIN) 250 MCG tablet Take 1 tablet by mouth daily.     No current facility-administered medications on file prior to visit.    Past Surgical History:  Procedure Laterality Date  . NO PAST SURGERIES      Allergies  Allergen Reactions  . Hydrogen Peroxide Other (See Comments) and Swelling    Social History   Socioeconomic History  . Marital status: Married    Spouse name: Not on file  . Number of children: Not on file  . Years of education: Not on file  . Highest education level: Not on file  Occupational History  . Not on file  Tobacco Use  . Smoking status: Former Smoker    Types: Cigarettes    Quit date: 08/16/2006    Years since quitting: 14.5  . Smokeless tobacco: Never Used  Vaping Use  . Vaping Use: Never used  Substance and Sexual Activity  . Alcohol use: Yes    Alcohol/week: 1.0 standard drink    Types: 1 Shots of liquor per week    Comment: social  . Drug use: No  . Sexual activity: Yes  Other Topics Concern  . Not on file  Social History Narrative  . Not on file   Social Determinants of Health   Financial Resource  Strain: Not on file  Food Insecurity: Not on file  Transportation Needs: Not on file  Physical Activity: Not on file  Stress: Not on file  Social Connections: Not on file  Intimate Partner Violence: Not on file    Family History  Problem Relation Age of Onset  . Hypertension Mother   . Skin cancer Mother   . CAD Father   . Hypertension Father   . Skin cancer Father     BP 100/72   Ht 5' (1.524 m)   Wt 125 lb (56.7 kg)   BMI 24.41 kg/m   No flowsheet data found.  No flowsheet data found.  Review of Systems: See HPI above.     Objective:  Physical Exam:  Gen: NAD, comfortable in exam room  Left hip: No deformity. FROM with 5/5 strength except 5-/5 hip abduction. Mild tenderness to  palpation proximal IT band. NVI distally. Negative logroll Negative faber, fadir, and piriformis stretches.  Right hip: No deformity. FROM with 5/5 strength. No tenderness to palpation. NVI distally. Negative logroll Negative faber, fadir, and piriformis stretches.  Left foot/ankle: Mod pronation.  No gross deformity, swelling, ecchymoses FROM No TTP Thompsons test negative. NV intact distally.   Assessment & Plan:  1. Bilateral hip pain - improving.  2/2 proximal IT band syndrome.  Continue home exercises.  F/u prn.  2. Left plantar fasciitis - also improved.  Continue home exercises, arch support.  F/u prn.

## 2021-03-22 ENCOUNTER — Other Ambulatory Visit: Payer: Self-pay

## 2021-03-23 ENCOUNTER — Encounter: Payer: Self-pay | Admitting: Family Medicine

## 2021-03-23 ENCOUNTER — Ambulatory Visit (INDEPENDENT_AMBULATORY_CARE_PROVIDER_SITE_OTHER): Payer: BC Managed Care – PPO | Admitting: Family Medicine

## 2021-03-23 VITALS — BP 116/68 | HR 87 | Temp 97.7°F | Ht 60.0 in | Wt 122.4 lb

## 2021-03-23 DIAGNOSIS — G35 Multiple sclerosis: Secondary | ICD-10-CM

## 2021-03-23 DIAGNOSIS — I10 Essential (primary) hypertension: Secondary | ICD-10-CM

## 2021-03-23 MED ORDER — AMLODIPINE BESYLATE 2.5 MG PO TABS: 2.5000 mg | ORAL_TABLET | Freq: Every day | ORAL | 3 refills | Status: DC

## 2021-03-23 NOTE — Progress Notes (Signed)
[facility] [address] [place] [address] Dept: [phone] Dept Fax: [phone]  Chronic Care Office Visit  Subjective:    Patient ID: Kelli Bright, female    DOB: [birth year], 50 y.o..   MRN: [id number]  Chief Complaint  Patient presents with  . Follow-up    3 month f/u HTN.     History of Present Illness:  Patient is in today for reassessment of chronic medical issues.   Kelli Bright has a history of hypertension managed with low dose amlodipine. She notes she is taking this without problems.  Kelli Bright has been following with Sports Medicine related to hip pain and right knee pain. She notes this is gradually improving under their care, though has not yet resolved.  Kelli Bright has a history of MS. She is followed by neurology. She notes this is stable on Vumerity.  Kelli Bright notes she receives her GYN care through Dr. Rogue Bussing. She denies prior abnormal paps and believes she is up to date.  Past Medical History: Patient Active Problem List   Diagnosis Date Noted  . 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 Plantar fasciitis of left foot 03/01/2021  . Borderline hyperlipidemia 12/22/2020  . Essential hypertension 11/11/2019  . Migraine 10/10/2019  . Adhesive capsulitis of right shoulder 09/21/2018  . Numbness and tingling 09/12/2013  . Multiple sclerosis ([place]) 09/07/2013   Past Surgical History:  Procedure Laterality Date  . NO PAST SURGERIES     Family History  Problem Relation Age of Onset  . Hypertension Mother   . Skin cancer Mother   . CAD Father   . Hypertension Father   . Skin cancer Father    Outpatient Medications Prior to Visit  Medication Sig Dispense Refill  . cetirizine (ZYRTEC) 10 MG tablet Take 10 mg by mouth daily.    . Cholecalciferol (VITAMIN D3) 10 MCG (400 UNIT) CAPS Take by mouth.    Drusilla Kanner EXTRACT PO Take 2 tablets by mouth daily.    . cyclobenzaprine (FLEXERIL) 10 MG  tablet cyclobenzaprine 10 mg tablet    . diphenhydrAMINE (BENADRYL) 25 mg capsule Benadryl 25 mg capsule  Take 2 capsules every 4 hours by oral route.    . Diroximel Fumarate, Starter, (VUMERITY, STARTER,) 231 MG CPDR Vumerity 231 mg capsule,delayed release  Take 2 capsules twice a day by oral route.    Marland Kitchen ibuprofen (ADVIL,MOTRIN) 200 MG tablet Take 400 mg by mouth every 8 (eight) hours as needed for headache.     . Magnesium 250 MG TABS Take by mouth.    . Methylcellulose, Laxative, (CITRUCEL PO) Take 1 tablet by mouth daily as needed (for constipation).    . Multiple Vitamin (MULTI-VITAMIN) tablet Take by mouth.    . Norgestimate-Ethinyl Estradiol Triphasic 0.18/0.215/0.25 MG-35 MCG tablet Take 1 tablet by mouth daily.    Marland Kitchen amLODipine (NORVASC) 2.5 MG tablet Take 2.5 mg by mouth daily.    Marland Kitchen loratadine (CLARITIN) 10 MG tablet Take by mouth. (Patient not taking: Reported on 03/23/2021)    . vitamin B-12 (CYANOCOBALAMIN) 250 MCG tablet Take 1 tablet by mouth daily. (Patient not taking: Reported on 03/23/2021)     No facility-administered medications prior to visit.   Allergies  Allergen Reactions  . Hydrogen Peroxide Other (See Comments) and Swelling    Objective:   Today's Vitals   03/23/21 0935  BP: 116/68  Pulse: 87  Temp: 97.7 F (36.5 C)  TempSrc: Temporal  SpO2: 99%  Weight: 122 lb 6.4 oz (55.5 kg)  Height: 5' (1.524 m)   Body mass index is 23.9 kg/m.   General: Well developed, well nourished. No acute distress. Psych: Alert and oriented x3. Normal mood and affect.  Health Maintenance Due  Topic Date Due  . HIV Screening  Never done  . Hepatitis C Screening  Never done  . PAP SMEAR-Modifier  Never done  . COLONOSCOPY (Pts 45-44yrs Insurance coverage will need to be confirmed)  Never done     Assessment & Plan:   1. Essential hypertension Blood pressure is at goal. Recommend continuing amlodipine.  - amLODipine (NORVASC) 2.5 MG tablet; Take 1 tablet (2.5 mg total)  by mouth daily.  Dispense: 45 tablet; Refill: 3  2. Multiple sclerosis ([place]) Stable on Vumerity. She will continue to follow with Dr. Moshe Cipro.  Haydee Salter, MD

## 2021-05-03 ENCOUNTER — Other Ambulatory Visit: Payer: Self-pay

## 2021-05-03 ENCOUNTER — Ambulatory Visit (INDEPENDENT_AMBULATORY_CARE_PROVIDER_SITE_OTHER): Payer: BC Managed Care – PPO | Admitting: Family Medicine

## 2021-05-03 VITALS — BP 119/84 | Ht 60.0 in | Wt 125.0 lb

## 2021-05-03 DIAGNOSIS — M25572 Pain in left ankle and joints of left foot: Secondary | ICD-10-CM | POA: Insufficient documentation

## 2021-05-03 NOTE — Assessment & Plan Note (Signed)
Acute. No acute trauma. 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 Suspect with some time and RICE therapy, this will self resolve. Do not feel imaging is warranted at this time. Recommended OTC topical and oral analgesics as needed. Ice PRN. Avoid walking on an elevation or other aggravating exercises. Follow up if no improvement, sooner if worsening. Patient voiced understanding and agreement with plan.

## 2021-05-03 NOTE — Patient Instructions (Signed)
You have an overuse strain of your flexor digitorum tendon. Ice the area 15 minutes at a time if needed. I expect this to improve over the next several days. Avoid inclines as much as possible for the next week. Can consider a boot if you're struggling but this is not typically necessary. Continue arch supports. Hold the heel walking for the next week also for the plantar fascia. Consider formal physical therapy, injection for the plantar fasciitis when you return if it's not improving. Follow up with me in about 6 weeks.

## 2021-05-03 NOTE — Progress Notes (Signed)
   Subjective:   Patient ID: Kelli Bright    DOB: Apr 05, 1971, 50 y.o. female   MRN: [id number]  Kelli Bright is a 50 y.o. female here for left ankle pain x 1 day. She notes that she was walking at an incline on the treadmill for about an hour. She normally slowly lowers the treadmill to cool down but was distracted and forgot to do this. She woke up the next morning with pain along her medial ankle just posterior to her medial malleolus. She denies any new trauma. Slow walking doesn't hurt but faster paced walking and uphill reproduces the pain. She continues to struggle with her left plantar fasciitis despite the home exercises and shoe inserts.   Review of Systems:  Per HPI.   Objective:   BP 119/84   Ht 5' (1.524 m)   Wt 125 lb (56.7 kg)   BMI 24.41 kg/m  Vitals and nursing note reviewed.  General: pleasant, sitting comfortably on exam bed, well nourished, well developed, in no acute distress with non-toxic appearance Resp: breathing comfortably on room air, speaking in full sentences Skin: warm, dry Extremities: warm and well perfused, normal tone MSK: gait normal Right Foot: Inspection:  No obvious bony deformity.  No swelling, erythema, or bruising.  Normal arch Palpation: TTP posterior to medial malleolus, some TTP along medial calcaneous as well. NO pain along achilles. ROM: Full  ROM of the ankle. Normal midfoot flexibility Strength: 5/5 strength ankle in all planes, pain with resisted toe flexion reproducing her pain. Neurovascular: N/V intact distally in the lower extremity Special tests: Negative anterior drawer. Negative squeeze. normal midfoot flexibility. Normal calcaneal motion with heel raise  Left Foot: Inspection:  No obvious bony deformity.  No swelling, erythema, or bruising.  Normal arch ROM: Full  ROM of the ankle.  Strength: 5/5 strength ankle in all planes  Neuro: Alert and oriented, speech normal  Assessment & Plan:   Acute left ankle  pain Acute. No acute trauma. 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 Suspect with some time and RICE therapy, this will self resolve. Do not feel imaging is warranted at this time. Recommended OTC topical and oral analgesics as needed. Ice PRN. Avoid walking on an elevation or other aggravating exercises. Follow up if no improvement, sooner if worsening. Patient voiced understanding and agreement with plan.   No orders of the defined types were placed in this encounter.  No orders of the defined types were placed in this encounter.     Mina Marble, DO PGY-3, [facility] 05/03/2021 11:59 AM

## 2021-05-04 ENCOUNTER — Encounter: Payer: Self-pay | Admitting: Family Medicine

## 2021-06-14 ENCOUNTER — Ambulatory Visit: Payer: BC Managed Care – PPO | Admitting: Family Medicine

## 2021-06-30 ENCOUNTER — Other Ambulatory Visit: Payer: Self-pay

## 2021-07-05 ENCOUNTER — Telehealth: Payer: Self-pay | Admitting: Family Medicine

## 2021-07-05 NOTE — Telephone Encounter (Signed)
Called patient informed her that she would need an appointment to fill out physical form,  [facility] and will see if they can fill out the form.  Form placed back up front for pick up. Dm/cma

## 2021-07-05 NOTE — Telephone Encounter (Signed)
Pt has dropped off paperwork to be filled out by Dr.Rudd.  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 Please advise when ready. She will come in to pick up. [phone].

## 2021-07-13 ENCOUNTER — Telehealth (INDEPENDENT_AMBULATORY_CARE_PROVIDER_SITE_OTHER): Payer: BC Managed Care – PPO | Admitting: Family Medicine

## 2021-07-13 ENCOUNTER — Encounter: Payer: Self-pay | Admitting: Family Medicine

## 2021-07-13 VITALS — Temp 99.4°F | Wt 125.0 lb

## 2021-07-13 DIAGNOSIS — U071 COVID-19: Secondary | ICD-10-CM | POA: Diagnosis not present

## 2021-07-13 MED ORDER — MOLNUPIRAVIR EUA 200MG CAPSULE: 4.0000 | ORAL_CAPSULE | Freq: Two times a day (BID) | ORAL | 0 refills | Status: AC

## 2021-07-13 MED ORDER — BENZONATATE 100 MG PO CAPS: 100.0000 mg | ORAL_CAPSULE | Freq: Three times a day (TID) | ORAL | 0 refills | Status: DC | PRN

## 2021-07-13 NOTE — Patient Instructions (Addendum)
---------------------------------------------------------------------------------------------------------------------------    WORK SLIP:  Patient Kelli Bright,  [birth year], was seen for a medical visit today, 07/13/21 . Please excuse from work for a COVID like illness. 50 We advise 10 days minimum from the onset of symptoms (07/11/21) PLUS 1 day of no fever and improved symptoms. Will defer to employer for a sooner return to work if symptoms have resolved, it is greater than 5 days since the positive test and the patient can wear a high-quality, tight fitting mask such as N95 or KN95 at all times for an additional 5 days. Would also suggest COVID19 antigen testing is negative prior to return.  Sincerely: E-signature: Dr. Kriste Basque, DO [facility] Ph: [phone]   ------------------------------------------------------------------------------------------------------------------------------   HOME CARE TIPS:  -I sent the medication(s) we discussed to your pharmacy: Meds ordered this encounter  Medications   molnupiravir EUA 200 mg CAPS    Sig: Take 4 capsules (800 mg total) by mouth 2 (two) times daily for 5 days.    Dispense:  40 capsule    Refill:  0   benzonatate (TESSALON PERLES) 100 MG capsule    Sig: Take 1 capsule (100 mg total) by mouth 3 (three) times daily as needed.    Dispense:  20 capsule    Refill:  0     -I sent in the Covid19 treatment or referral you requested per our discussion. Please see the information provided below and discuss further with the pharmacist/treatment team.   -If taking molnupiravir there is a chance of rebound illness after finishing your treatment. If you become sick again please isolate for an additional 5 days.    -can use tylenol if needed for fevers, aches and pains per instructions  -can use nasal saline a few times per day if you have nasal congestion; sometimes  a short course of Afrin nasal spray for 3  days can help with symptoms as well  -stay hydrated, drink plenty of fluids and eat small healthy meals - avoid dairy  -follow up with your doctor in 2-3 days unless improving and feeling better  -stay home while sick, except to seek medical care. If you have COVID19, ideally it would be best to stay home for a full 10 days since the onset of symptoms PLUS one day of no fever and feeling better. Wear a good mask that fits snugly (such as N95 or KN95) if around others to reduce the risk of transmission.  It was nice to meet you today, and I really hope you are feeling better soon. I help [facility] out with telemedicine visits on Tuesdays and Thursdays and am available for visits on those days. If you have any concerns or questions following this visit please schedule a follow up visit with your Primary Care doctor or seek care at a local urgent care clinic to avoid delays in care.    Seek in person care or schedule a follow up video visit promptly if your symptoms worsen, new concerns arise or you are not improving with treatment. Call 911 and/or seek emergency care if your symptoms are severe or life threatening.    Fact Sheet for Patients And Caregivers Emergency Use Authorization (EUA) Of LAGEVRIO (molnupiravir) capsules For Coronavirus Disease 2019 (COVID-19)  What is the most important information I should know about LAGEVRIO? LAGEVRIO may cause serious side effects, including: ? LAGEVRIO may cause harm to your unborn baby. It is not known if LAGEVRIO will harm your baby if  you take LAGEVRIO during pregnancy. o LAGEVRIO is not recommended for use in pregnancy. o LAGEVRIO has not been studied in pregnancy. LAGEVRIO was studied in pregnant animals only. When LAGEVRIO was given to pregnant animals, LAGEVRIO caused harm to their unborn babies. o You and your healthcare provider may decide that you should take LAGEVRIO during pregnancy if there are no other COVID-19 treatment options  approved or authorized by the FDA that are accessible or clinically appropriate for you. o If you and your healthcare provider decide that you should take LAGEVRIO during pregnancy, you and your healthcare provider should discuss the known and potential benefits and the potential risks of taking LAGEVRIO during pregnancy. For individuals who are able to become pregnant: ? You should use a reliable method of birth control (contraception) consistently and correctly during treatment with LAGEVRIO and for 4 days after the last dose of LAGEVRIO. Talk to your healthcare provider about reliable birth control methods. ? Before starting treatment with [facility] your healthcare provider may do a pregnancy test to see if you are pregnant before starting treatment with LAGEVRIO. ? Tell your healthcare provider right away if you become pregnant or think you may be pregnant during treatment with LAGEVRIO. Pregnancy Surveillance Program: ? There is a pregnancy surveillance program for individuals who take LAGEVRIO during pregnancy. The purpose of this program is to collect information about the health of you and your baby. Talk to your healthcare provider about how to take part in this program. ? If you take LAGEVRIO during pregnancy and you agree to participate in the pregnancy surveillance program and allow your healthcare provider to share your information with [employer], then your healthcare provider will report your use of LAGEVRIO during pregnancy to [employer] by calling [phone] or RelayImage.ca. For individuals who are sexually active with partners who are able to become pregnant: ? It is not known if LAGEVRIO can affect sperm. While the risk is regarded as low, animal studies to fully assess the potential for LAGEVRIO to affect the babies of males treated with LAGEVRIO have not been completed. A reliable method of birth control (contraception) should be  used consistently and correctly during treatment with LAGEVRIO and for at least 3 months after the last dose. The risk to sperm beyond 3 months is not known. Studies to understand the risk to sperm beyond 3 months are ongoing. Talk to your healthcare provider about reliable birth control methods. Talk to your healthcare provider if you have questions or concerns about how LAGEVRIO may affect sperm. You are being given this fact sheet because your healthcare provider believes it is necessary to provide you with LAGEVRIO for the treatment of adults with mild-to-moderate coronavirus disease 2019 (COVID-19) with positive results of direct SARS-CoV-2 viral testing, and who are at high risk for progression to severe COVID-19 including hospitalization or death, and for whom other COVID-19 treatment options approved or authorized by the FDA are not accessible or clinically appropriate. The U.S. Food and Drug Administration (FDA) has issued an Emergency Use Authorization (EUA) to make LAGEVRIO available during the COVID-19 pandemic (for more details about an EUA please see What is an Emergency Use Authorization? at the end of this document). LAGEVRIO is not an FDA-approved medicine in the [place]. Read this Fact Sheet for information about LAGEVRIO. Talk to your healthcare provider about your options if you have any questions. It is your choice to take LAGEVRIO.  What is COVID-19? COVID-19 is caused  by a virus called a coronavirus. You can get COVID-19 through close contact with another person who has the virus. COVID-19 illnesses have ranged from very mild-to-severe, including illness resulting in death. While information so far suggests that most COVID-19 illness is mild, serious illness can happen and may cause some of your other medical conditions to become worse. Older people and people of all ages with severe, long lasting (chronic) medical conditions like heart disease, lung disease  and diabetes, for example seem to be at higher risk of being hospitalized for COVID-19.  What is LAGEVRIO? LAGEVRIO is an investigational medicine used to treat mild-to-moderate COVID-19 in adults: ? with positive results of direct SARS-CoV-2 viral testing, and ? who are at high risk for progression to severe COVID-19 including hospitalization or death, and for whom other COVID-19 treatment options approved or authorized by the FDA are not accessible or clinically appropriate. The FDA has authorized the emergency use of LAGEVRIO for the treatment of mild-tomoderate COVID-19 in adults under an EUA. For more information on EUA, see the What is an Emergency Use Authorization (EUA)? section at the end of this Fact Sheet. LAGEVRIO is not authorized: ? for use in people less than 96 years of age. ? for prevention of COVID-19. ? for people needing hospitalization for COVID-19. ? for use for longer than 5 consecutive days.  What should I tell my healthcare provider before I take LAGEVRIO? Tell your healthcare provider if you: ? Have any allergies ? Are breastfeeding or plan to breastfeed ? Have any serious illnesses ? Are taking any medicines (prescription, over-the-counter, vitamins, or herbal products).  How do I take LAGEVRIO? ? Take LAGEVRIO exactly as your healthcare provider tells you to take it. ? Take 4 capsules of LAGEVRIO every 12 hours (for example, at 8 am and at 8 pm) ? Take LAGEVRIO for 5 days. It is important that you complete the full 5 days of treatment with LAGEVRIO. Do not stop taking LAGEVRIO before you complete the full 5 days of treatment, even if you feel better. ? Take LAGEVRIO with or without food. ? You should stay in isolation for as long as your healthcare provider tells you to. Talk to your healthcare provider if you are not sure about how to properly isolate while you have COVID-19. ? Swallow LAGEVRIO capsules whole. Do not open, break, or crush the capsules.  If you cannot swallow capsules whole, tell your healthcare provider. ? What to do if you miss a dose: o If it has been less than 10 hours since the missed dose, take it as soon as you remember o If it has been more than 10 hours since the missed dose, skip the missed dose and take your dose at the next scheduled time. ? Do not double the dose of LAGEVRIO to make up for a missed dose.  What are the important possible side effects of LAGEVRIO? ? See, What is the most important information I should know about LAGEVRIO? ? Allergic Reactions. Allergic reactions can happen in people taking LAGEVRIO, even after only 1 dose. Stop taking LAGEVRIO and call your healthcare provider right away if you get any of the following symptoms of an allergic reaction: o hives o rapid heartbeat o trouble swallowing or breathing o swelling of the mouth, lips, or face o throat tightness o hoarseness o skin rash The most common side effects of LAGEVRIO are: ? diarrhea ? nausea ? dizziness These are not all the possible side effects of LAGEVRIO.  Not many people have taken LAGEVRIO. Serious and unexpected side effects may happen. This medicine is still being studied, so it is possible that all of the risks are not known at this time.  What other treatment choices are there?  Veklury (remdesivir) is FDA-approved as an intravenous (IV) infusion for the treatment of mildto-moderate COVID-19 in certain adults and children. Talk with your doctor to see if Elias Else is appropriate for you. Like LAGEVRIO, FDA may also allow for the emergency use of other medicines to treat people with COVID-19. Go to http://www.austin-beck.biz/ for more information. It is your choice to be treated or not to be treated with LAGEVRIO. Should you decide not to take it, it will not change your standard medical care.  What if I am  breastfeeding? Breastfeeding is not recommended during treatment with LAGEVRIO and for 4 days after the last dose of LAGEVRIO. If you are breastfeeding or plan to breastfeed, talk to your healthcare provider about your options and specific situation before taking LAGEVRIO.  How do I report side effects with LAGEVRIO? Contact your healthcare provider if you have any side effects that bother you or do not go away. Report side effects to FDA MedWatch at MacRetreat.be or call 1-800-FDA-1088 (323-489-2001).  How should I store LAGEVRIO? ? Store LAGEVRIO capsules at room temperature between 75F to 44F (20C to 25C). ? Keep LAGEVRIO and all medicines out of the reach of children and pets. How can I learn more about COVID-19? ? Ask your healthcare provider. ? Visit IndexCrawler.co.za ? Contact your local or state public health department. ? Call [employer] at [phone] (toll free in the [place] ? Visit www.molnupiravir.com  What Is an Emergency Use Authorization (EUA)? The [place] FDA has made LAGEVRIO available under an emergency access mechanism called an Emergency Use Authorization (EUA) The EUA is supported by a Insurance account manager Health and Human Service (HHS) declaration that circumstances exist to justify emergency use of drugs and biological products during the COVID-19 pandemic. LAGEVRIO for the treatment of mild-to-moderate COVID-19 in adults with positive results of direct SARS-CoV-2 viral testing, who are at high risk for progression to severe COVID-19, including hospitalization or death, and for whom alternative COVID-19 treatment options approved or authorized by FDA are not accessible or clinically appropriate, has not undergone the same type of review as an FDA-approved product. In issuing an EUA under the COVID-19 public health emergency, the FDA has determined, among other things, that based on the total amount of scientific evidence available  including data from adequate and well-controlled clinical trials, if available, it is reasonable to believe that the product may be effective for diagnosing, treating, or preventing COVID-19, or a serious or life-threatening disease or condition caused by COVID-19; that the known and potential benefits of the product, when used to diagnose, treat, or prevent such disease or condition, outweigh the known and potential risks of such product; and that there are no adequate, approved, and available alternatives.  All of these criteria must be met to allow for the product to be used in the treatment of patients during the COVID-19 pandemic. The EUA for LAGEVRIO is in effect for the duration of the COVID-19 declaration justifying emergency use of LAGEVRIO, unless terminated or revoked (after which LAGEVRIO may no longer be used under the EUA). For patent information: LeaseGuru.tn Copyright  2021-2022 [employer] [place], [place] and its affiliates. All rights reserved. [id number] Revised: March 2022

## 2021-07-13 NOTE — Progress Notes (Signed)
Virtual Visit via Video Note  I connected with Jobana  on 07/13/21 at  6:00 PM EDT by a video enabled telemedicine application and verified that I am speaking with the correct person using two identifiers.  Location patient: home, [place] Location provider:work or home office Persons participating in the virtual visit: patient, provider, patient's husband  I discussed the limitations of evaluation and management by telemedicine and the availability of in person appointments. The patient expressed understanding and agreed to proceed.   HPI:  Acute telemedicine visit for Covid19: -Onset: 2 days ago -Symptoms include: nasal congestion, sore throat, cough, low grade fever around 99 -Denies: NVD, CP, SOB, inability to eat/drink/get out of bed -Pertinent past medical history: see below -Pertinent medication allergies:  Allergies  Allergen Reactions   Hydrogen Peroxide Other (See Comments) and Swelling  -COVID-19 vaccine status: vaccinated x2 and had one booster -she has not had any labs to check her kidneys -she does want to take an antiviral -denies any chance of pregnancy  ROS: See pertinent positives and negatives per HPI.  Past Medical History:  Diagnosis Date   Hypertension    Medical history non-contributory    MS (congenital mitral stenosis)     Past Surgical History:  Procedure Laterality Date   NO PAST SURGERIES       Current Outpatient Medications:    amLODipine (NORVASC) 2.5 MG tablet, Take 1 tablet (2.5 mg total) by mouth daily., Disp: 45 tablet, Rfl: 3   cetirizine (ZYRTEC) 10 MG tablet, Take 10 mg by mouth daily., Disp: , Rfl:    Cholecalciferol (VITAMIN D3) 10 MCG (400 UNIT) CAPS, Take by mouth., Disp: , Rfl:    CRANBERRY EXTRACT PO, Take 2 tablets by mouth daily., Disp: , Rfl:    cyclobenzaprine (FLEXERIL) 10 MG tablet, cyclobenzaprine 10 mg tablet, Disp: , Rfl:    diphenhydrAMINE (BENADRYL) 25 mg capsule, Take 25 mg by mouth at bedtime., Disp: , Rfl:     Diroximel Fumarate, Starter, (VUMERITY, STARTER,) 231 MG CPDR, Vumerity 231 mg capsule,delayed release  Take 2 capsules twice a day by oral route., Disp: , Rfl:    ibuprofen (ADVIL,MOTRIN) 200 MG tablet, Take 400 mg by mouth every 8 (eight) hours as needed for headache. , Disp: , Rfl:    Magnesium 250 MG TABS, Take by mouth., Disp: , Rfl:    Methylcellulose, Laxative, (CITRUCEL PO), Take 1 tablet by mouth daily as needed (for constipation)., Disp: , Rfl:    Multiple Vitamin (MULTI-VITAMIN) tablet, Take by mouth., Disp: , Rfl:    Norgestimate-Ethinyl Estradiol Triphasic 0.18/0.215/0.25 MG-35 MCG tablet, Take 1 tablet by mouth daily., Disp: , Rfl:   EXAM:  VITALS per patient if applicable:  GENERAL: alert, oriented, appears well and in no acute distress  HEENT: atraumatic, conjunttiva clear, no obvious abnormalities on inspection of external nose and ears  NECK: normal movements of the head and neck  LUNGS: on inspection no signs of respiratory distress, breathing rate appears normal, no obvious gross SOB, gasping or wheezing  CV: no obvious cyanosis  MS: moves all visible extremities without noticeable abnormality  PSYCH/NEURO: pleasant and cooperative, no obvious depression or anxiety, speech and thought processing grossly intact  ASSESSMENT AND PLAN:  Discussed the following assessment and plan:  COVID-19   Discussed treatment options (infusions and oral options and risk of drug interactions), ideal treatment window, potential complications, isolation and precautions for COVID-19.  Discussed possibility of rebound with antivirals and the need to reisolate if it should occur  for 5 days. No recent labs and she prefers to not got get labs. Checked for/reviewed any labs done in the last 90 days with GFR listed in HPI if available. After lengthy discussion, the patient opted for treatment with molnupiravir due to being higher risk for complications of covid or severe disease and other  factors. Discussed EUA status of this drug and the fact that there is preliminary limited knowledge of risks/interactions/side effects per EUA document vs possible benefits and precautions. This information was shared with patient during the visit and also was provided in patient instructions. Also, advised that patient discuss risks/interactions and use with pharmacist/treatment team as well.  The patient did want a prescription for cough, Tessalon Rx sent.  Other symptomatic care measures summarized in patient instructions. Work/School slipped offered: provided in patient instructions   Advised to seek prompt in person care if worsening, new symptoms arise, or if is not improving with treatment. Discussed options for inperson care if PCP office not available. Did let this patient know that I only do telemedicine on Tuesdays and Thursdays for [place]. Advised to schedule follow up visit with PCP or [facility] if any further questions or concerns to avoid delays in care.   I discussed the assessment and treatment plan with the patient. The patient was provided an opportunity to ask questions and all were answered. The patient agreed with the plan and demonstrated an understanding of the instructions.     Lucretia Kern, DO

## 2021-07-23 ENCOUNTER — Telehealth: Payer: Self-pay

## 2021-07-23 NOTE — Telephone Encounter (Signed)
Patient called with c/o having returning covid symptoms that started yesterday.  Original positive Covid test was 07/12/21, she had a VV with Dr Maudie Mercury on 07/13/21 and given an antiviral medication to take.  She finished that and went back to work on Monday 07/19/21.  She did take her vitals and Bp 122/76, pulse 92, tem 99.8. she started with ST, congestion, flushing (hot flashes), and a warming feeling on her chest.  She did retest and it was positive for covid 07/23/21.   She wanted to let us know if there was anything that she should be doing or be concerned about.  Please review and advise.  Thanks. Dm/cma

## 2021-07-26 NOTE — Telephone Encounter (Signed)
Lft VM to rtn call. Dm/cma  

## 2021-07-26 NOTE — Telephone Encounter (Signed)
Spoke to patient VIA phone, and she states that she felt better today and went back to work.  She still has a lingering cough but knows that can take weeks to go away.  Advised of recommendations and she will call back to schedule an appointment if no better in a couple weeks. Dm/cma

## 2021-07-29 ENCOUNTER — Telehealth: Payer: BC Managed Care – PPO | Admitting: Family Medicine

## 2021-07-30 ENCOUNTER — Encounter: Payer: Self-pay | Admitting: Family Medicine

## 2021-08-26 ENCOUNTER — Telehealth: Payer: Self-pay

## 2021-08-26 NOTE — Telephone Encounter (Signed)
Lft VM that we received the order for Blood work from [facility].   We are not able to do due to billing reasons. Advised that she can come by and pick up order to take to [facility]. Dm/cma

## 2021-09-13 ENCOUNTER — Ambulatory Visit: Payer: BC Managed Care – PPO | Admitting: Family Medicine

## 2021-09-13 ENCOUNTER — Encounter: Payer: Self-pay | Admitting: Family Medicine

## 2021-09-13 ENCOUNTER — Ambulatory Visit (INDEPENDENT_AMBULATORY_CARE_PROVIDER_SITE_OTHER): Payer: BC Managed Care – PPO | Admitting: Family Medicine

## 2021-09-13 ENCOUNTER — Other Ambulatory Visit: Payer: Self-pay

## 2021-09-13 VITALS — BP 104/68 | HR 87 | Temp 97.5°F | Wt 119.2 lb

## 2021-09-13 DIAGNOSIS — G35 Multiple sclerosis: Secondary | ICD-10-CM | POA: Diagnosis not present

## 2021-09-13 DIAGNOSIS — I10 Essential (primary) hypertension: Secondary | ICD-10-CM

## 2021-09-13 NOTE — Progress Notes (Signed)
[facility] [address] [place] [address] Dept: [phone] Dept Fax: [phone]  Chronic Care Office Visit  Subjective:    Patient ID: Kelli Bright, female    DOB: 16-Aug-1971, 50 y.o..   MRN: [id number]  Chief Complaint  Patient presents with   Follow-up    6 month follow up, no concerns.     History of Present Illness:  Patient is in today for reassessment of chronic medical issues.  Both Kelli Bright and her husband have returned to work. 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 Overall, it has been good for her to return to work.  Kelli Bright has a history of hypertension managed with low dose amlodipine. She notes she is taking this without problems.   Kelli Bright has a history of MS. was seen by Dr. Gypsy Balsam in Sept. She is managed on Vumerity. Dr. Gypsy Balsam is pleased with her current response. He plans a follow-up MRI in April.   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   Plantar fasciitis of left foot 03/01/2021   Borderline hyperlipidemia 12/22/2020   Essential hypertension 11/11/2019   Migraine 10/10/2019   Adhesive capsulitis of right shoulder 09/21/2018   Numbness and tingling 09/12/2013   Multiple sclerosis ([place]) 09/07/2013   Past Surgical History:  Procedure Laterality Date   NO PAST SURGERIES     Family History  Problem Relation Age of Onset   Hypertension Mother    Skin cancer Mother    CAD Father    Hypertension Father    Skin cancer Father    Outpatient Medications Prior to Visit  Medication Sig Dispense Refill   amLODipine (NORVASC) 2.5 MG tablet Take 1 tablet (2.5 mg total) by mouth daily. 45 tablet 3   cetirizine (ZYRTEC) 10 MG tablet Take 10 mg by mouth daily.     Cholecalciferol (VITAMIN D3) 10 MCG (400 UNIT) CAPS Take by mouth.      CRANBERRY EXTRACT PO Take 2 tablets by mouth daily.     cyclobenzaprine (FLEXERIL) 10 MG tablet cyclobenzaprine 10 mg tablet     diphenhydrAMINE (BENADRYL) 25 mg capsule Take 25 mg by mouth at bedtime.     Diroximel Fumarate, Starter, (VUMERITY, STARTER,) 231 MG CPDR Vumerity 231 mg capsule,delayed release  Take 2 capsules twice a day by oral route.     ibuprofen (ADVIL,MOTRIN) 200 MG tablet Take 400 mg by mouth every 8 (eight) hours as needed for headache.      Magnesium 250 MG TABS Take by mouth.     Methylcellulose, Laxative, (CITRUCEL PO) Take 1 tablet by mouth daily as needed (for constipation).     Multiple Vitamin (MULTI-VITAMIN) tablet Take by mouth.     Norgestimate-Ethinyl Estradiol Triphasic 0.18/0.215/0.25 MG-35 MCG tablet Take 1 tablet by mouth daily.     benzonatate (TESSALON PERLES) 100 MG capsule Take 1 capsule (100 mg total) by mouth 3 (three) times daily as needed. 20 capsule 0   No facility-administered medications prior to visit.   Allergies  Allergen Reactions   Hydrogen Peroxide Other (See Comments) and Swelling   Objective:   Today's Vitals   09/13/21 1537  BP: 104/68  Pulse: 87  Temp: (!) 97.5 F (36.4 C)  TempSrc: Temporal  SpO2: 100%  Weight: 119 lb 3.2 oz (54.1 kg)   Body mass index  is 23.28 kg/m.   General: Well developed, well nourished. No acute distress. Psych: Alert and oriented. Normal mood and affect.  Health Maintenance Due  Topic Date Due   Pneumococcal Vaccine 77-85 Years old (1 - PCV) Never done   HIV Screening  Never done   Hepatitis C Screening  Never done   PAP SMEAR-Modifier  Never done   COLONOSCOPY (Pts 45-9yrs Insurance coverage will need to be confirmed)  Never done     Lab Results:  Ref Range & Units 1 mo ago  WBC (White Blood Cell Count) 3.2 - 9.8 x10^9/L 10.7 High    Hemoglobin 12.0 - 15.5 g/dL 12.6   Hematocrit 35.0 - 45.0 % 37.7   Platelets 150 - 450 x10^9/L 342   MCV (Mean Corpuscular Volume) 80 - 98 fL 89   MCH  (Mean Corpuscular Hemoglobin) 26.5 - 34.0 pg 29.6   MCHC (Mean Corpuscular Hemoglobin Concentration) 31.4 - 36.0 % 33.4   RBC (Red Blood Cell Count) 3.77 - 5.16 x10^12/L 4.26   RDW-CV (Red Cell Distribution Width) 11.5 - 14.5 % 13.4   NRBC (Nucleated Red Blood Cell Count) 0 x10^9/L 0.00   NRBC % (Nucleated Red Blood Cell %) % 0.0   MPV (Mean Platelet Volume) 7.2 - 11.7 fL 9.8   Neutrophil Count 2.0 - 8.6 x10^9/L 10.3 High    Neutrophil % 37 - 80 % 95.7 High    Lymphocyte Count 0.6 - 4.2 x10^9/L 0.3 Low    Lymphocyte % 10 - 50 % 2.9 Low    Monocyte Count 0 - 0.9 x10^9/L 0.1   Monocyte % 0 - 12 % 0.9   Eosinophil Count 0 - 0.70 x10^9/L 0.01   Eosinophil % 0 - 7 % 0.1   Basophil Count 0 - 0.20 x10^9/L 0.01   Basophil % 0 - 2 % 0.1   Immature Granulocyte Count <=0.06 x10^9/L 0.03   Immature Granulocyte % <=0.7 % 0.3     Assessment & Plan:   1. Essential hypertension Blood pressure at goal. We will continue Kelli Bright on amlodipine. I will reassess her in 6 months.  2. Multiple sclerosis [facility]) Reviewed neurology notes. Stable on diroximel (Vumerity).  Haydee Salter, MD

## 2021-10-06 ENCOUNTER — Other Ambulatory Visit: Payer: Self-pay | Admitting: Family Medicine

## 2021-10-06 DIAGNOSIS — I10 Essential (primary) hypertension: Secondary | ICD-10-CM

## 2021-11-21 ENCOUNTER — Encounter: Payer: Self-pay | Admitting: Family Medicine

## 2021-12-01 ENCOUNTER — Encounter: Payer: Self-pay | Admitting: Podiatry

## 2021-12-01 ENCOUNTER — Other Ambulatory Visit: Payer: Self-pay

## 2021-12-01 ENCOUNTER — Ambulatory Visit: Payer: BC Managed Care – PPO | Admitting: Podiatry

## 2021-12-01 DIAGNOSIS — L603 Nail dystrophy: Secondary | ICD-10-CM | POA: Diagnosis not present

## 2021-12-01 NOTE — Progress Notes (Signed)
°  Subjective:  Patient ID: Kelli Bright, female    DOB: January 25, 1971,   MRN: [id number]  Chief Complaint  Patient presents with   Ingrown Toenail    Pt is here for possible ingrown toe nails. States that they look like they are. No pain, swelling , redness    51 y.o. female presents for concern of right second ingrown toenail as well as all of her toenails. Realtes she had plantar fasciitis and was using an insert in her shoes and felt the shoes were pushing on the nails. Denies pain today but wondering how to take care of these nails. . Denies any other pedal complaints. Denies n/v/f/c.   Past Medical History:  Diagnosis Date   Hypertension    Medical history non-contributory    MS (congenital mitral stenosis)     Objective:  Physical Exam: Vascular: DP/PT pulses 2/4 bilateral. CFT <3 seconds. Normal hair growth on digits. No edema.  Skin. No lacerations or abrasions bilateral feet. Pincer type nails noted to nails 1-5 bilateral. No signs of infection.  Musculoskeletal: MMT 5/5 bilateral lower extremities in DF, PF, Inversion and Eversion. Deceased ROM in DF of ankle joint.  Neurological: Sensation intact to light touch.   Assessment:   1. Onychodystrophy      Plan:  Patient was evaluated and treated and all questions answered. -Examined patient -Discussed treatment options for painful dystrophic nails  -Discussed proper trimming.  -Discussed proper foot wear as well.  -Patient to return as needed.    Lorenda Peck, DPM

## 2022-03-02 ENCOUNTER — Ambulatory Visit: Payer: BC Managed Care – PPO | Admitting: Family Medicine

## 2022-03-02 VITALS — BP 112/69 | Ht 60.0 in | Wt 125.0 lb

## 2022-03-02 DIAGNOSIS — G8929 Other chronic pain: Secondary | ICD-10-CM

## 2022-03-02 DIAGNOSIS — M25561 Pain in right knee: Secondary | ICD-10-CM

## 2022-03-02 NOTE — Patient Instructions (Signed)
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 ?Avoid painful activities when possible (often deep squats, lunges, leg press bother this). ?Cross train with swimming, cycling with low resistance, elliptical if needed. ?Straight leg raise, hip side raises, straight leg raises with foot turned outwards 3 sets of 10 once a day. ?Add ankle weight if these become too easy. ?Consider formal physical therapy. ?Arch supports can help but aren't a major risk factor for you. ?Avoid flat shoes, barefoot walking as much as possible. ?Icing 15 minutes at a time 3-4 times a day as needed. ?Tylenol or ibuprofen as needed for pain. ?Follow up with me in 6 weeks. ? ?

## 2022-03-03 ENCOUNTER — Encounter: Payer: Self-pay | Admitting: Family Medicine

## 2022-03-03 NOTE — Progress Notes (Signed)
PCP: Haydee Salter, MD ? ?Subjective:  ? ?HPI: ?Patient is a 51 y.o. female here for right knee pain. ? ?Patient reports she hurt her right knee back in 2020 and has had intermittent pain in right knee since. ?However, pain has become more consistent recently. ?Pain and popping noted anterior knee. ?Tried KT tape without benefit, sleeve with some help. ?Worse using stairs. ?Pain around the kneecap more laterally. ?No locking, giving out. ? ?Past Medical History:  ?Diagnosis Date  ? Hypertension   ? Medical history non-contributory   ? MS (congenital mitral stenosis)   ? ? ?Current Outpatient Medications on File Prior to Visit  ?Medication Sig Dispense Refill  ? amLODipine (NORVASC) 2.5 MG tablet TAKE 1 TABLET(2.5 MG) BY MOUTH DAILY 90 tablet 1  ? cetirizine (ZYRTEC) 10 MG tablet Take 10 mg by mouth daily.    ? Cholecalciferol (VITAMIN D3) 10 MCG (400 UNIT) CAPS Take by mouth.    ? CRANBERRY EXTRACT PO Take 2 tablets by mouth daily.    ? cyclobenzaprine (FLEXERIL) 10 MG tablet cyclobenzaprine 10 mg tablet    ? diphenhydrAMINE (BENADRYL) 25 mg capsule Take 25 mg by mouth at bedtime.    ? Diroximel Fumarate, Starter, (VUMERITY, STARTER,) 231 MG CPDR Vumerity 231 mg capsule,delayed release ? Take 2 capsules twice a day by oral route.    ? ibuprofen (ADVIL,MOTRIN) 200 MG tablet Take 400 mg by mouth every 8 (eight) hours as needed for headache.     ? Magnesium 250 MG TABS Take by mouth.    ? Methylcellulose, Laxative, (CITRUCEL PO) Take 1 tablet by mouth daily as needed (for constipation).    ? Multiple Vitamin (MULTI-VITAMIN) tablet Take by mouth.    ? Norgestimate-Ethinyl Estradiol Triphasic 0.18/0.215/0.25 MG-35 MCG tablet Take 1 tablet by mouth daily.    ? ?No current facility-administered medications on file prior to visit.  ? ? ?Past Surgical History:  ?Procedure Laterality Date  ? NO PAST SURGERIES    ? ? ?Allergies  ?Allergen Reactions  ? Hydrogen Peroxide Other (See Comments) and Swelling  ? ? ?BP 112/69   Ht  5' (1.524 m)   Wt 125 lb (56.7 kg)   BMI 24.41 kg/m?  ? ? ?  03/02/2022  ?  3:35 PM  ?[place] [facility] Exercise  ?Frequency of aerobic exercise (# of days/week) 7  ?Average time in minutes 75  ?Frequency of strengthening activities (# of days/week) 2  ? ? ?   ? View : No data to display.  ?  ?  ?  ? ? ?    ?Objective:  ?Physical Exam: ? ?Gen: NAD, comfortable in exam room ? ?Right knee: ?No gross deformity, ecchymoses, swelling.  Notable plica laterally with patellar shift.  VMo atrophy. ?Mild TTP lateral patella.  No joint line tenderness. ?FROM with normal strength except 4/5 hip abduction strength. ?Negative ant/post drawers. Negative valgus/varus testing. Negative lachman. ?Negative mcmurrays, apleys. ?NV intact distally. ?  ?Assessment & Plan:  ?1. Right knee pain - 2/2 patellofemoral syndrome with lateral plica.  Home exercises reviewed.  Icing, tylenol or ibuprofen.  Consider arch supports though her long arches are relatively well preserved.  Formal physical therapy if not improving.  F/u in 6 weeks. ?

## 2022-03-15 ENCOUNTER — Encounter: Payer: Self-pay | Admitting: Family Medicine

## 2022-03-15 ENCOUNTER — Ambulatory Visit (INDEPENDENT_AMBULATORY_CARE_PROVIDER_SITE_OTHER): Payer: BC Managed Care – PPO | Admitting: Family Medicine

## 2022-03-15 VITALS — BP 130/70 | HR 80 | Temp 98.7°F | Ht 60.0 in | Wt 126.6 lb

## 2022-03-15 DIAGNOSIS — G35 Multiple sclerosis: Secondary | ICD-10-CM | POA: Diagnosis not present

## 2022-03-15 DIAGNOSIS — I10 Essential (primary) hypertension: Secondary | ICD-10-CM | POA: Diagnosis not present

## 2022-03-15 DIAGNOSIS — Z1211 Encounter for screening for malignant neoplasm of colon: Secondary | ICD-10-CM

## 2022-03-15 NOTE — Progress Notes (Signed)
?[facility] ?[address] ?[place] [address] ?Dept: [phone] ?Dept Fax: [phone] ? ?Chronic Care Office Visit ? ?Subjective:  ? ? Patient ID: Kelli Bright, female    DOB: 30-Aug-1971, 51 y.o..   MRN: [id number] ? ?Chief Complaint  ?Patient presents with  ? Follow-up  ?  6 month f/u for HTN / MS ?No concerns today. ?  ? ?History of Present Illness: ? ?Patient is in today for reassessment of chronic medical issues. ? ?Ms. Poppe has a history of hypertension managed with low dose amlodipine. She notes she is taking this without problems. ?  ?Ms. Mcguffin has a history of MS. She is managed by Dr. Gypsy Balsam in Sept. She is managed on Vumerity. She notes that overall she is managed well on this medication and that her MS appears to be in full remission. She is working as a Building control surveyor and looking forward to having the summer off. ? ?Past Medical History: ?Patient Active Problem List  ? Diagnosis Date Noted  ? Acute left ankle pain 05/03/2021  ? 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 Plantar fasciitis of left foot 03/01/2021  ? Borderline hyperlipidemia 12/22/2020  ? Essential hypertension 11/11/2019  ? Migraine 10/10/2019  ? Adhesive capsulitis of right shoulder 09/21/2018  ? Numbness and tingling 09/12/2013  ? Multiple sclerosis ([place]) 09/07/2013  ? ?Past Surgical History:  ?Procedure Laterality Date  ? NO PAST SURGERIES    ? ?Family History  ?Problem Relation Age of Onset  ? Hypertension Mother   ? Skin cancer Mother   ? CAD Father   ? Hypertension Father   ? Skin cancer Father   ? ?Outpatient Medications Prior to Visit  ?Medication Sig Dispense Refill  ? amLODipine (NORVASC) 2.5 MG tablet TAKE 1 TABLET(2.5 MG) BY MOUTH DAILY 90 tablet 1  ? cetirizine (ZYRTEC) 10 MG tablet Take 10 mg by mouth daily.    ? Cholecalciferol (VITAMIN D3) 10 MCG (400 UNIT) CAPS Take by mouth.    ? CRANBERRY EXTRACT PO Take 2 tablets by mouth daily.    ? cyclobenzaprine  (FLEXERIL) 10 MG tablet cyclobenzaprine 10 mg tablet    ? diphenhydrAMINE (BENADRYL) 25 mg capsule Take 25 mg by mouth at bedtime.    ? Diroximel Fumarate, Starter, (VUMERITY, STARTER,) 231 MG CPDR Vumerity 231 mg capsule,delayed release ? Take 2 capsules twice a day by oral route.    ? ibuprofen (ADVIL,MOTRIN) 200 MG tablet Take 400 mg by mouth every 8 (eight) hours as needed for headache.     ? Magnesium 250 MG TABS Take by mouth.    ? Methylcellulose, Laxative, (CITRUCEL PO) Take 1 tablet by mouth daily as needed (for constipation).    ? Multiple Vitamin (MULTI-VITAMIN) tablet Take by mouth.    ? Norgestimate-Ethinyl Estradiol Triphasic 0.18/0.215/0.25 MG-35 MCG tablet Take 1 tablet by mouth daily.    ? ?No facility-administered medications prior to visit.  ? ?Allergies  ?Allergen Reactions  ? Hydrogen Peroxide Other (See Comments) and Swelling  ? ?Objective:  ? ?Today's Vitals  ? 03/15/22 1542  ?BP: 130/70  ?Pulse: 80  ?Temp: 98.7 ?F (37.1 ?C)  ?TempSrc: Temporal  ?SpO2: 98%  ?Weight: 126 lb 9.6 oz (57.4 kg)  ?Height: 5' (1.524 m)  ? ?Body mass index is 24.72 kg/m?.  ? ?General: Well developed, well nourished. No acute distress. ?Psych: Alert and oriented. Normal mood and affect. ? ?Health Maintenance Due  ?Topic Date Due  ? HIV Screening  Never  done  ? Hepatitis C Screening  Never done  ? PAP SMEAR-Modifier  Never done  ? COLONOSCOPY (Pts 45-36yr Insurance coverage will need to be confirmed)  Never done  ?   ?Assessment & Plan:  ? ?1. Essential hypertension ?Blood pressure is adequately controlled. Continue amlodipine 2.5 mg daily. ? ?2. [place] ?In remission on Vulerity. Continue to follow with Dr. SGypsy Balsam ? ?3. Screening for colon cancer ? ?- Ambulatory referral to Gastroenterology ? ?Return in about 6 months (around 09/15/2022) for Reassessment.  ? ?SHaydee Salter MD ?

## 2022-03-17 ENCOUNTER — Other Ambulatory Visit: Payer: Self-pay | Admitting: Family Medicine

## 2022-03-17 DIAGNOSIS — I10 Essential (primary) hypertension: Secondary | ICD-10-CM

## 2022-03-25 ENCOUNTER — Encounter: Payer: Self-pay | Admitting: Gastroenterology

## 2022-04-13 ENCOUNTER — Encounter: Payer: Self-pay | Admitting: Family Medicine

## 2022-04-13 ENCOUNTER — Ambulatory Visit (INDEPENDENT_AMBULATORY_CARE_PROVIDER_SITE_OTHER): Payer: BC Managed Care – PPO | Admitting: Family Medicine

## 2022-04-13 VITALS — BP 110/76 | Ht 60.0 in | Wt 125.0 lb

## 2022-04-13 DIAGNOSIS — G8929 Other chronic pain: Secondary | ICD-10-CM

## 2022-04-13 DIAGNOSIS — M25561 Pain in right knee: Secondary | ICD-10-CM | POA: Diagnosis not present

## 2022-04-13 NOTE — Progress Notes (Signed)
PCP: Haydee Salter, MD  Subjective:   HPI: Patient is a 51 y.o. female here for right knee pain.  4/26: Patient reports she hurt her right knee back in 2020 and has had intermittent pain in right knee since. However, pain has become more consistent recently. Pain and popping noted anterior knee. Tried KT tape without benefit, sleeve with some help. Worse using stairs. Pain around the kneecap more laterally. No locking, giving out.  6/7: Patient reports she's doing well. Doing home exercises regularly. Still bothers her with stairs sometimes.  Past Medical History:  Diagnosis Date   Hypertension    Medical history non-contributory    MS (congenital mitral stenosis)     Current Outpatient Medications on File Prior to Visit  Medication Sig Dispense Refill   amLODipine (NORVASC) 2.5 MG tablet TAKE 1 TABLET BY MOUTH EVERY DAY 30 tablet 6   cetirizine (ZYRTEC) 10 MG tablet Take 10 mg by mouth daily.     Cholecalciferol (VITAMIN D3) 10 MCG (400 UNIT) CAPS Take by mouth.     CRANBERRY EXTRACT PO Take 2 tablets by mouth daily.     cyclobenzaprine (FLEXERIL) 10 MG tablet cyclobenzaprine 10 mg tablet     diphenhydrAMINE (BENADRYL) 25 mg capsule Take 25 mg by mouth at bedtime.     Diroximel Fumarate, Starter, (VUMERITY, STARTER,) 231 MG CPDR Vumerity 231 mg capsule,delayed release  Take 2 capsules twice a day by oral route.     ibuprofen (ADVIL,MOTRIN) 200 MG tablet Take 400 mg by mouth every 8 (eight) hours as needed for headache.      Magnesium 250 MG TABS Take by mouth.     Methylcellulose, Laxative, (CITRUCEL PO) Take 1 tablet by mouth daily as needed (for constipation).     Multiple Vitamin (MULTI-VITAMIN) tablet Take by mouth.     Norgestimate-Ethinyl Estradiol Triphasic 0.18/0.215/0.25 MG-35 MCG tablet Take 1 tablet by mouth daily.     No current facility-administered medications on file prior to visit.    Past Surgical History:  Procedure Laterality Date   NO PAST  SURGERIES      Allergies  Allergen Reactions   Hydrogen Peroxide Other (See Comments) and Swelling    BP 110/76   Ht 5' (1.524 m)   Wt 125 lb (56.7 kg)   BMI 24.41 kg/m      03/02/2022    3:35 PM  [place] [facility] Exercise  Frequency of aerobic exercise (# of days/week) 7  Average time in minutes 75  Frequency of strengthening activities (# of days/week) 2        View : No data to display.              Objective:  Physical Exam:  Gen: NAD, comfortable in exam room  Right knee: No gross deformity, ecchymoses, swelling.  Plica not palpable on flexion to extension now.  Mild VMO atrophy. No TTP. FROM with normal strength including hip abduction Negative ant/post drawers. Negative valgus/varus testing. Negative lachman.  Negative mcmurrays, apleys.  NV intact distally.   Assessment & Plan:  1. Right knee pain - 2/2 patellofemoral syndrome with lateral plica.  Much improved with home exercises.  Continue these for 6 more weeks.  F/u prn.

## 2022-04-19 ENCOUNTER — Ambulatory Visit (AMBULATORY_SURGERY_CENTER): Payer: Self-pay

## 2022-04-19 ENCOUNTER — Other Ambulatory Visit: Payer: Self-pay

## 2022-04-19 VITALS — Ht 60.0 in | Wt 124.2 lb

## 2022-04-19 DIAGNOSIS — Z1211 Encounter for screening for malignant neoplasm of colon: Secondary | ICD-10-CM

## 2022-04-19 NOTE — Progress Notes (Signed)
Denies allergies to eggs or soy products. Denies complication of anesthesia or sedation. Denies use of weight loss medication. Denies use of O2.   Emmi instructions given for colonoscopy.  

## 2022-05-03 ENCOUNTER — Encounter: Payer: Self-pay | Admitting: Gastroenterology

## 2022-05-09 ENCOUNTER — Encounter: Payer: Self-pay | Admitting: Gastroenterology

## 2022-05-17 ENCOUNTER — Ambulatory Visit (AMBULATORY_SURGERY_CENTER): Payer: BC Managed Care – PPO | Admitting: Gastroenterology

## 2022-05-17 ENCOUNTER — Encounter: Payer: Self-pay | Admitting: Gastroenterology

## 2022-05-17 VITALS — BP 122/80 | HR 67 | Temp 97.8°F | Resp 14 | Ht 60.0 in | Wt 124.2 lb

## 2022-05-17 DIAGNOSIS — K635 Polyp of colon: Secondary | ICD-10-CM | POA: Diagnosis not present

## 2022-05-17 DIAGNOSIS — K6289 Other specified diseases of anus and rectum: Secondary | ICD-10-CM

## 2022-05-17 DIAGNOSIS — D124 Benign neoplasm of descending colon: Secondary | ICD-10-CM

## 2022-05-17 DIAGNOSIS — Z1211 Encounter for screening for malignant neoplasm of colon: Secondary | ICD-10-CM

## 2022-05-17 DIAGNOSIS — K648 Other hemorrhoids: Secondary | ICD-10-CM

## 2022-05-17 LAB — HM COLONOSCOPY

## 2022-05-17 MED ORDER — SODIUM CHLORIDE 0.9 % IV SOLN: 500.0000 mL | Freq: Once | INTRAVENOUS | Status: DC

## 2022-05-17 NOTE — Progress Notes (Signed)
GASTROENTEROLOGY PROCEDURE H&P NOTE   Primary Care Physician: Haydee Salter, MD  HPI: Kelli Bright is a 51 y.o. female who presents for Colonoscopy for screening.  Past Medical History:  Diagnosis Date   Allergy    Hypertension    Medical history non-contributory    MS (congenital mitral stenosis)    Neuromuscular disorder (HCC)    Past Surgical History:  Procedure Laterality Date   NO PAST SURGERIES     Current Outpatient Medications  Medication Sig Dispense Refill   amLODipine (NORVASC) 2.5 MG tablet TAKE 1 TABLET BY MOUTH EVERY DAY 30 tablet 6   cetirizine (ZYRTEC) 10 MG tablet Take 10 mg by mouth daily.     Cholecalciferol (VITAMIN D3) 10 MCG (400 UNIT) CAPS Take by mouth.     CRANBERRY EXTRACT PO Take 2 tablets by mouth daily.     cyclobenzaprine (FLEXERIL) 10 MG tablet cyclobenzaprine 10 mg tablet     Diroximel Fumarate, Starter, (VUMERITY, STARTER,) 231 MG CPDR Vumerity 231 mg capsule,delayed release  Take 2 capsules twice a day by oral route.     ibuprofen (ADVIL,MOTRIN) 200 MG tablet Take 400 mg by mouth every 8 (eight) hours as needed for headache.      Magnesium 250 MG TABS Take by mouth.     Methylcellulose, Laxative, (CITRUCEL PO) Take 1 tablet by mouth daily as needed (for constipation).     Multiple Vitamin (MULTI-VITAMIN) tablet Take by mouth.     Norgestimate-Ethinyl Estradiol Triphasic 0.18/0.215/0.25 MG-35 MCG tablet Take 1 tablet by mouth daily.     OVER THE COUNTER MEDICATION Pro-biotic one every other day.     OVER THE COUNTER MEDICATION Melatonin, one tablet at bedtime.     OVER THE COUNTER MEDICATION Unisom, one tablet at bedtime.     polyethylene glycol powder (GLYCOLAX/MIRALAX) 17 GM/SCOOP powder Take 1 Container by mouth once.     No current facility-administered medications for this visit.    Current Outpatient Medications:    amLODipine (NORVASC) 2.5 MG tablet, TAKE 1 TABLET BY MOUTH EVERY DAY, Disp: 30 tablet, Rfl: 6   cetirizine  (ZYRTEC) 10 MG tablet, Take 10 mg by mouth daily., Disp: , Rfl:    Cholecalciferol (VITAMIN D3) 10 MCG (400 UNIT) CAPS, Take by mouth., Disp: , Rfl:    CRANBERRY EXTRACT PO, Take 2 tablets by mouth daily., Disp: , Rfl:    cyclobenzaprine (FLEXERIL) 10 MG tablet, cyclobenzaprine 10 mg tablet, Disp: , Rfl:    Diroximel Fumarate, Starter, (VUMERITY, STARTER,) 231 MG CPDR, Vumerity 231 mg capsule,delayed release  Take 2 capsules twice a day by oral route., Disp: , Rfl:    ibuprofen (ADVIL,MOTRIN) 200 MG tablet, Take 400 mg by mouth every 8 (eight) hours as needed for headache. , Disp: , Rfl:    Magnesium 250 MG TABS, Take by mouth., Disp: , Rfl:    Methylcellulose, Laxative, (CITRUCEL PO), Take 1 tablet by mouth daily as needed (for constipation)., Disp: , Rfl:    Multiple Vitamin (MULTI-VITAMIN) tablet, Take by mouth., Disp: , Rfl:    Norgestimate-Ethinyl Estradiol Triphasic 0.18/0.215/0.25 MG-35 MCG tablet, Take 1 tablet by mouth daily., Disp: , Rfl:    OVER THE COUNTER MEDICATION, Pro-biotic one every other day., Disp: , Rfl:    OVER THE COUNTER MEDICATION, Melatonin, one tablet at bedtime., Disp: , Rfl:    OVER THE COUNTER MEDICATION, Unisom, one tablet at bedtime., Disp: , Rfl:    polyethylene glycol powder (GLYCOLAX/MIRALAX) 17 GM/SCOOP powder, Take 1  Container by mouth once., Disp: , Rfl:  Allergies  Allergen Reactions   Hydrogen Peroxide Other (See Comments) and Swelling   Family History  Problem Relation Age of Onset   Hypertension Mother    Skin cancer Mother    CAD Father    Hypertension Father    Skin cancer Father    Colon cancer Neg Hx    Esophageal cancer Neg Hx    Rectal cancer Neg Hx    Stomach cancer Neg Hx    Social History   Socioeconomic History   Marital status: Married    Spouse name: Not on file   Number of children: Not on file   Years of education: Not on file   Highest education level: Not on file  Occupational History   Not on file  Tobacco Use    Smoking status: Former    Types: Cigarettes    Quit date: 08/16/2006    Years since quitting: 15.7   Smokeless tobacco: Never  Vaping Use   Vaping Use: Never used  Substance and Sexual Activity   Alcohol use: Yes    Alcohol/week: 1.0 standard drink of alcohol    Types: 1 Shots of liquor per week    Comment: social   Drug use: No   Sexual activity: Yes  Other Topics Concern   Not on file  Social History Narrative   Not on file   Social Determinants of Health   Financial Resource Strain: Not on file  Food Insecurity: Not on file  Transportation Needs: Not on file  Physical Activity: Not on file  Stress: Not on file  Social Connections: Not on file  Intimate Partner Violence: Not on file    Physical Exam: There were no vitals filed for this visit. There is no height or weight on file to calculate BMI. GEN: NAD EYE: Sclerae anicteric ENT: MMM CV: Non-tachycardic GI: Soft, NT/ND NEURO:  Alert & Oriented x 3  Lab Results: No results for input(s): "WBC", "HGB", "HCT", "PLT" in the last 72 hours. BMET No results for input(s): "NA", "K", "CL", "CO2", "GLUCOSE", "BUN", "CREATININE", "CALCIUM" in the last 72 hours. LFT No results for input(s): "PROT", "ALBUMIN", "AST", "ALT", "ALKPHOS", "BILITOT", "BILIDIR", "IBILI" in the last 72 hours. PT/INR No results for input(s): "LABPROT", "INR" in the last 72 hours.   Impression / Plan: This is a 51 y.o.female who presents for Colonoscopy for screening.  The risks and benefits of endoscopic evaluation/treatment were discussed with the patient and/or family; these include but are not limited to the risk of perforation, infection, bleeding, missed lesions, lack of diagnosis, severe illness requiring hospitalization, as well as anesthesia and sedation related illnesses.  The patient's history has been reviewed, patient examined, no change in status, and deemed stable for procedure.  The patient and/or family is agreeable to proceed.     Justice Britain, MD [facility] Office # [phone]

## 2022-05-17 NOTE — Op Note (Signed)
[facility] Patient Name: Kelli Bright Procedure Date: 05/17/2022 10:25 AM MRN: [id number] Endoscopist: Justice Britain , MD Age: 51 Referring MD:  Date of Birth: 11-12-1970 Gender: Female Account #: [id number] Procedure:                Colonoscopy Indications:              Screening for colorectal malignant neoplasm Medicines:                Monitored Anesthesia Care Procedure:                Pre-Anesthesia Assessment:                           - Prior to the procedure, a History and Physical                            was performed, and patient medications and                            allergies were reviewed. The patient's tolerance of                            previous anesthesia was also reviewed. The risks                            and benefits of the procedure and the sedation                            options and risks were discussed with the patient.                            All questions were answered, and informed consent                            was obtained. Prior Anticoagulants: The patient has                            taken no previous anticoagulant or antiplatelet                            agents except for NSAID medication. ASA Grade                            Assessment: II - A patient with mild systemic                            disease. After reviewing the risks and benefits,                            the patient was deemed in satisfactory condition to                            undergo the procedure.  After obtaining informed consent, the colonoscope                            was passed under direct vision. Throughout the                            procedure, the patient's blood pressure, pulse, and                            oxygen saturations were monitored continuously. The                            Olympus PCF-H190DL ([id number]) Colonoscope was                            introduced through the anus and  advanced to the the                            cecum, identified by appendiceal orifice and                            ileocecal valve. The colonoscopy was somewhat                            difficult due to a redundant colon and significant                            looping. Successful completion of the procedure was                            aided by changing the patient's position, using                            manual pressure, withdrawing and reinserting the                            scope, straightening and shortening the scope to                            obtain bowel loop reduction and using scope                            torsion. The patient tolerated the procedure. The                            quality of the bowel preparation was adequate. The                            ileocecal valve, appendiceal orifice, and rectum                            were photographed. Scope In: 10:51:28 AM Scope Out: 11:14:30 AM Scope Withdrawal Time: 0 hours 17 minutes 3 seconds  Total Procedure Duration: 0 hours  23 minutes 2 seconds  Findings:                 The digital rectal exam findings include                            hemorrhoids. Pertinent negatives include no                            palpable rectal lesions.                           The colon (entire examined portion) was grossly                            redundant leading to looping.                           A 8 mm polyp was found in the descending colon. The                            polyp was sessile. The polyp was removed with a                            cold snare. Resection and retrieval were complete.                           Normal mucosa was found in the entire colon                            otherwise.                           Anal papillae were hypertrophied.                           Non-bleeding non-thrombosed external and internal                            hemorrhoids were found during retroflexion,  during                            perianal exam and during digital exam. The                            hemorrhoids were Grade II (internal hemorrhoids                            that prolapse but reduce spontaneously). Complications:            No immediate complications. Estimated Blood Loss:     Estimated blood loss was minimal. Impression:               - Hemorrhoids found on digital rectal exam.                           - Redundant colon, leading to signifncatly.                           -  One 8 mm polyp in the descending colon, removed                            with a cold snare. Resected and retrieved.                           - Normal mucosa in the entire examined colon                            otherwise.                           - Anal papillae were hypertrophied. Non-bleeding                            non-thrombosed external and internal hemorrhoids. Recommendation:           - The patient will be observed post-procedure,                            until all discharge criteria are met.                           - Discharge patient to home.                           - Patient has a contact number available for                            emergencies. The signs and symptoms of potential                            delayed complications were discussed with the                            patient. Return to normal activities tomorrow.                            Written discharge instructions were provided to the                            patient.                           - High fiber diet.                           - Continue your fiber as you are doing.                           - Continue present medications.                           - Await pathology results.                           - Repeat colonoscopy  in 5-10 years for surveillance                            based on pathology results.                           - The findings and recommendations were discussed                             with the patient.                           - The findings and recommendations were discussed                            with the patient's family. Justice Britain, MD 05/17/2022 11:19:56 AM

## 2022-05-17 NOTE — Patient Instructions (Signed)
YOU HAD AN ENDOSCOPIC PROCEDURE TODAY AT THE [facility]:   Refer to the procedure report that was given to you for any specific questions about what was found during the examination.  If the procedure report does not answer your questions, please call your gastroenterologist to clarify.  If you requested that your care partner not be given the details of your procedure findings, then the procedure report has been included in a sealed envelope for you to review at your convenience later.  YOU SHOULD EXPECT: Some feelings of bloating in the abdomen. Passage of more gas than usual.  Walking can help get rid of the air that was put into your GI tract during the procedure and reduce the bloating. If you had a lower endoscopy (such as a colonoscopy or flexible sigmoidoscopy) you may notice spotting of blood in your stool or on the toilet paper. If you underwent a bowel prep for your procedure, you may not have a normal bowel movement for a few days.  Please Note:  You might notice some irritation and congestion in your nose or some drainage.  This is from the oxygen used during your procedure.  There is no need for concern and it should clear up in a day or so.  SYMPTOMS TO REPORT IMMEDIATELY:  Following lower endoscopy (colonoscopy or flexible sigmoidoscopy):  Excessive amounts of blood in the stool  Significant tenderness or worsening of abdominal pains  Swelling of the abdomen that is new, acute  Fever of 100F or higher  Following upper endoscopy (EGD)  Vomiting of blood or coffee ground material  New chest pain or pain under the shoulder blades  Painful or persistently difficult swallowing  New shortness of breath  Fever of 100F or higher  Black, tarry-looking stools  For urgent or emergent issues, a gastroenterologist can be reached at any hour by calling [phone]. Do not use MyChart messaging for urgent concerns.    DIET:  We do recommend a small meal at first, but  then you may proceed to your regular diet.  Drink plenty of fluids but you should avoid alcoholic beverages for 24 hours.  ACTIVITY:  You should plan to take it easy for the rest of today and you should NOT DRIVE or use heavy machinery until tomorrow (because of the sedation medicines used during the test).    FOLLOW UP: Our staff will call the number listed on your records the next business day following your procedure.  We will call around 7:15- 8:00 am to check on you and address any questions or concerns that you may have regarding the information given to you following your procedure. If we do not reach you, we will leave a message.  If you develop any symptoms (ie: fever, flu-like symptoms, shortness of breath, cough etc.) before then, please call [phone].  If you test positive for Covid 19 in the 2 weeks post procedure, please call and report this information to us.    If any biopsies were taken you will be contacted by phone or by letter within the next 1-3 weeks.  Please call us at [phone] if you have not heard about the biopsies in 3 weeks.    SIGNATURES/CONFIDENTIALITY: You and/or your care partner have signed paperwork which will be entered into your electronic medical record.  These signatures attest to the fact that that the information above on your After Visit Summary has been reviewed and is understood.  Full responsibility of the confidentiality   of this discharge information lies with you and/or your care-partner.  

## 2022-05-17 NOTE — Progress Notes (Signed)
To pacu, VSS. Report to [email or web address] 

## 2022-05-17 NOTE — Progress Notes (Signed)
VS by CW  Pt's states no medical or surgical changes since previsit or office visit.  

## 2022-05-17 NOTE — Progress Notes (Signed)
Called to room to assist during endoscopic procedure.  Patient ID and intended procedure confirmed with present staff. Received instructions for my participation in the procedure from the performing physician.  

## 2022-05-18 ENCOUNTER — Telehealth: Payer: Self-pay | Admitting: *Deleted

## 2022-05-18 NOTE — Telephone Encounter (Signed)
  Follow up Call-     05/17/2022   10:09 AM  Call back number  Post procedure Call Back phone  # [phone]  Permission to leave phone message Yes     Patient questions:  Do you have a fever, pain , or abdominal swelling? Yes.   Pain Score  3 * pt c/o gas pains. States she did not move around much yesterday after procedure. RN encouraged pt to ambulate and to try OTC GasX if needed. Instructed pt is discomfort persists or worsens to call back. Pt verbalized understanding.   Have you tolerated food without any problems? Yes.    Have you been able to return to your normal activities? Yes.    Do you have any questions about your discharge instructions: Diet   No. Medications  No. Follow up visit  No.  Do you have questions or concerns about your Care? No.  Actions: * If pain score is 4 or above: No action needed, pain <4.

## 2022-05-23 ENCOUNTER — Encounter: Payer: Self-pay | Admitting: Gastroenterology

## 2022-06-05 ENCOUNTER — Encounter: Payer: Self-pay | Admitting: Family Medicine

## 2022-08-24 LAB — HM MAMMOGRAPHY

## 2022-08-30 ENCOUNTER — Encounter: Payer: Self-pay | Admitting: Family Medicine

## 2022-09-14 ENCOUNTER — Ambulatory Visit: Payer: BC Managed Care – PPO | Admitting: Family Medicine

## 2022-09-14 VITALS — BP 124/74 | HR 95 | Temp 97.5°F | Ht 60.0 in | Wt 122.6 lb

## 2022-09-14 DIAGNOSIS — I10 Essential (primary) hypertension: Secondary | ICD-10-CM

## 2022-09-14 DIAGNOSIS — G35 Multiple sclerosis: Secondary | ICD-10-CM | POA: Diagnosis not present

## 2022-09-14 MED ORDER — AMLODIPINE BESYLATE 2.5 MG PO TABS: 2.5000 mg | ORAL_TABLET | Freq: Every day | ORAL | 3 refills | Status: DC

## 2022-09-14 NOTE — Progress Notes (Signed)
[facility] [address] [place] [address] Dept: [phone] Dept Fax: [phone]  Chronic Care Office Visit  Subjective:    Patient ID: Kelli Bright, female    DOB: 1970-12-10, 50 y.o..   MRN: [id number]  Chief Complaint  Patient presents with   Follow-up    6 month f/u , no concerns.      History of Present Illness:  Patient is in today for reassessment of chronic medical issues.  Kelli Bright has a history of hypertension managed amlodipine 2.5 mg daily. She notes she is taking this without problems and pleased with the response.   Kelli Bright has a history of MS. She is managed by Kelli Bright. She is managed on Vumerity. She notes that overall she is managed well on this medication and that her MS appears to be in full remission.   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   Plantar fasciitis of left foot 03/01/2021   Borderline hyperlipidemia 12/22/2020   Essential hypertension 11/11/2019   Migraine 10/10/2019   Adhesive capsulitis of right shoulder 09/21/2018   Numbness and tingling 09/12/2013   Multiple sclerosis ([place]) 09/07/2013   Past Surgical History:  Procedure Laterality Date   NO PAST SURGERIES     Family History  Problem Relation Age of Onset   Hypertension Mother    Skin cancer Mother    CAD Father    Hypertension Father    Skin cancer Father    Colon cancer Neg Hx    Esophageal cancer Neg Hx    Rectal cancer Neg Hx    Stomach cancer Neg Hx    Outpatient Medications Prior to Visit  Medication Sig Dispense Refill   albuterol (VENTOLIN HFA) 108 (90 Base) MCG/ACT inhaler Inhale 2 puffs into the lungs every 4 (four) hours as needed.     cetirizine (ZYRTEC) 10 MG tablet Take 10 mg by mouth daily.     Cholecalciferol (VITAMIN D3) 10 MCG (400 UNIT) CAPS Take by mouth.     CRANBERRY EXTRACT PO  Take 2 tablets by mouth daily.     cyclobenzaprine (FLEXERIL) 10 MG tablet cyclobenzaprine 10 mg tablet     Diroximel Fumarate, Starter, (VUMERITY, STARTER,) 231 MG CPDR Vumerity 231 mg capsule,delayed release  Take 2 capsules twice a day by oral route.     ibuprofen (ADVIL,MOTRIN) 200 MG tablet Take 400 mg by mouth every 8 (eight) hours as needed for headache.      Magnesium 250 MG TABS Take by mouth.     Methylcellulose, Laxative, (CITRUCEL PO) Take 1 tablet by mouth daily as needed (for constipation).     Multiple Vitamin (MULTI-VITAMIN) tablet Take by mouth.     Norgestimate-Ethinyl Estradiol Triphasic 0.18/0.215/0.25 MG-35 MCG tablet Take 1 tablet by mouth daily.     OVER THE COUNTER MEDICATION Pro-biotic one every other day.     OVER THE COUNTER MEDICATION Melatonin, one tablet at bedtime.     OVER THE COUNTER MEDICATION Unisom, one tablet at bedtime.     amLODipine (NORVASC) 2.5 MG tablet TAKE 1 TABLET BY MOUTH EVERY DAY 30 tablet 6   nitrofurantoin, macrocrystal-monohydrate, (MACROBID) 100 MG capsule Take 1 capsule as needed by oral route.     No facility-administered medications prior to visit.   Allergies  Allergen Reactions   Hydrogen Peroxide Other (See Comments) and Swelling  Objective:   Today's Vitals   09/14/22 1558  BP: 124/74  Pulse: 95  Temp: (!) 97.5 F (36.4 C)  TempSrc: Temporal  SpO2: 97%  Weight: 122 lb 9.6 oz (55.6 kg)  Height: 5' (1.524 m)   Body mass index is 23.94 kg/m.   General: Well developed, well nourished. No acute distress. Psych: Alert and oriented. Normal mood and affect.  Health Maintenance Due  Topic Date Due   HIV Screening  Never done   Hepatitis C Screening  Never done     Assessment & Plan:   1. Essential hypertension Blood pressure is at goal. I will continue her on amlodipine.  - amLODipine (NORVASC) 2.5 MG tablet; Take 1 tablet (2.5 mg total) by mouth daily.  Dispense: 90 tablet; Refill: 3  2. Multiple sclerosis  ([place]) Continue to follow with Kelli Bright. Continue Vumerity 231 mg 2 caps bid.   Return in about 6 months (around 03/15/2023) for Annual preventative care.   Kelli Salter, MD

## 2022-12-06 ENCOUNTER — Telehealth: Payer: Self-pay | Admitting: Family Medicine

## 2022-12-07 NOTE — Telephone Encounter (Signed)
error 

## 2023-03-15 ENCOUNTER — Encounter: Payer: BC Managed Care – PPO | Admitting: Family Medicine

## 2023-03-17 ENCOUNTER — Encounter: Payer: BC Managed Care – PPO | Admitting: Family Medicine

## 2023-03-21 ENCOUNTER — Ambulatory Visit (INDEPENDENT_AMBULATORY_CARE_PROVIDER_SITE_OTHER): Payer: BC Managed Care – PPO | Admitting: Family Medicine

## 2023-03-21 ENCOUNTER — Encounter: Payer: Self-pay | Admitting: Family Medicine

## 2023-03-21 VITALS — BP 118/74 | HR 77 | Temp 98.3°F | Ht 60.0 in | Wt 124.8 lb

## 2023-03-21 DIAGNOSIS — G35 Multiple sclerosis: Secondary | ICD-10-CM

## 2023-03-21 DIAGNOSIS — I10 Essential (primary) hypertension: Secondary | ICD-10-CM | POA: Diagnosis not present

## 2023-03-21 DIAGNOSIS — Z0000 Encounter for general adult medical examination without abnormal findings: Secondary | ICD-10-CM

## 2023-03-21 DIAGNOSIS — E785 Hyperlipidemia, unspecified: Secondary | ICD-10-CM

## 2023-03-21 NOTE — Progress Notes (Signed)
[facility] [address] [place] [address] Dept: [phone] Dept Fax: [phone]  Annual Physical Visit  Subjective:    Patient ID: Kelli Bright, female    DOB: 04/12/1971, 52 y.o..   MRN: [id number]  Chief Complaint  Patient presents with   Annual Exam    CPE/labs.  No concerns.     History of Present Illness:  Patient is in today for an annual physical/preventative visit.  [name] has a history of hypertension managed amlodipine 2.5 mg daily. She notes she is taking this without problems and pleased with the response.   Ms. Tell has a history of MS. She is managed by Dr. Jayme Cloud. She is managed on Vumerity. She notes that overall she is managed well on this medication and that her MS appears to be in full remission.   Review of Systems  Constitutional:  Negative for chills, diaphoresis, fever, malaise/fatigue and weight loss.  HENT:  Negative for congestion, ear pain, hearing loss, sinus pain, sore throat and tinnitus.   Eyes:  Negative for blurred vision, pain, discharge and redness.  Respiratory:  Negative for cough, shortness of breath and wheezing.   Cardiovascular:  Negative for chest pain and palpitations.  Gastrointestinal:  Negative for abdominal pain, constipation, diarrhea, heartburn, nausea and vomiting.  Musculoskeletal:  Negative for back pain, joint pain and myalgias.  Skin:  Negative for itching and rash.  Psychiatric/Behavioral:  Negative for depression. The patient is not nervous/anxious.    Past Medical History: Patient Active Problem List   Diagnosis Date Noted   Borderline hyperlipidemia 12/22/2020   Essential hypertension 11/11/2019   Migraine 10/10/2019   Numbness and tingling 09/12/2013   Multiple sclerosis (HCC) 09/07/2013   Past Surgical History:  Procedure Laterality Date   NO PAST SURGERIES     Family History  Problem Relation Age of Onset   Hypertension Mother     Skin cancer Mother    CAD Father    Hypertension Father    Skin cancer Father    Colon cancer Neg Hx    Esophageal cancer Neg Hx    Rectal cancer Neg Hx    Stomach cancer Neg Hx    Outpatient Medications Prior to Visit  Medication Sig Dispense Refill   albuterol (VENTOLIN HFA) 108 (90 Base) MCG/ACT inhaler Inhale 2 puffs into the lungs every 4 (four) hours as needed.     amLODipine (NORVASC) 2.5 MG tablet Take 1 tablet (2.5 mg total) by mouth daily. 90 tablet 3   cetirizine (ZYRTEC) 10 MG tablet Take 10 mg by mouth daily.     Cholecalciferol (VITAMIN D3) 10 MCG (400 UNIT) CAPS Take by mouth.     CRANBERRY EXTRACT PO Take 2 tablets by mouth daily.     cyclobenzaprine (FLEXERIL) 10 MG tablet cyclobenzaprine 10 mg tablet     Diroximel Fumarate, Starter, (VUMERITY, STARTER,) 231 MG CPDR Vumerity 231 mg capsule,delayed release  Take 2 capsules twice a day by oral route.     ibuprofen (ADVIL,MOTRIN) 200 MG tablet Take 400 mg by mouth every 8 (eight) hours as needed for headache.      Magnesium 250 MG TABS Take by mouth.     Methylcellulose, Laxative, (CITRUCEL PO) Take 1 tablet by mouth daily as needed (for constipation).     Multiple Vitamin (MULTI-VITAMIN) tablet Take by mouth.     Norgestimate-Ethinyl Estradiol Triphasic 0.18/0.215/0.25 MG-35 MCG tablet Take 1 tablet by mouth daily.     OVER  THE COUNTER MEDICATION Pro-biotic one every other day.     OVER THE COUNTER MEDICATION Melatonin, one tablet at bedtime.     OVER THE COUNTER MEDICATION Unisom, one tablet at bedtime.     No facility-administered medications prior to visit.   Allergies  Allergen Reactions   Hydrogen Peroxide Other (See Comments) and Swelling   Objective:   Today's Vitals   03/21/23 1545  BP: 118/74  Pulse: 77  Temp: 98.3 F (36.8 C)  TempSrc: Temporal  SpO2: 98%  Weight: 124 lb 12.8 oz (56.6 kg)  Height: 5' (1.524 m)   Body mass index is 24.37 kg/m.   General: Well developed, well nourished. No  acute distress. HEENT: Normocephalic, non-traumatic. PERRL, EOMI. Conjunctiva clear. External ears normal. EAC and   TMs normal bilaterally. Nose clear without congestion or rhinorrhea. Mucous membranes moist.   Oropharynx clear. Good dentition. Neck: Supple. No lymphadenopathy. No thyromegaly. Lungs: Clear to auscultation bilaterally. No wheezing, rales or rhonchi. CV: RRR without murmurs or rubs. Pulses 2+ bilaterally. Abdomen: Soft, non-tender. Bowel sounds positive, normal pitch and frequency. No hepatosplenomegaly.   No rebound or guarding. Back: Straight. No CVA tenderness bilaterally. Extremities: Full ROM. No joint swelling or tenderness. No edema noted. Skin: Warm and dry. No rashes. Neuro: CN II-XII intact. Normal sensation and DTR bilaterally. Psych: Alert and oriented. Normal mood and affect.  Health Maintenance Due  Topic Date Due   HIV Screening  Never done   Hepatitis C Screening  Never done   PAP SMEAR-Modifier  02/20/2021     Assessment & Plan:   Problem List Items Addressed This Visit       Cardiovascular and Mediastinum   Essential hypertension    Blood pressure is in good control. Continue amlodipine 2.5 mg daily. We will check renal function today.      Relevant Orders   Basic metabolic panel     Nervous and Auditory   Multiple sclerosis (HCC)    In remission. She will continue Vumerity under the direction of her neurologist.        Other   Borderline hyperlipidemia    We will repeat lipids today.      Relevant Orders   Lipid panel   Other Visit Diagnoses     Annual physical exam    -  Primary   Overall excellent health. Discussed recommended screenings She plans pap smear with GYN in the Fall.       Return in about 6 months (around 09/21/2023) for Reassessment.   Loyola Mast, MD

## 2023-03-21 NOTE — Assessment & Plan Note (Signed)
In remission. She will continue Vumerity under the direction of her neurologist.

## 2023-03-21 NOTE — Assessment & Plan Note (Addendum)
Blood pressure is in good control. Continue amlodipine 2.5 mg daily. We will check renal function today.

## 2023-03-21 NOTE — Assessment & Plan Note (Signed)
We will repeat lipids today. 

## 2023-03-22 LAB — LIPID PANEL
Cholesterol: 205 mg/dL — ABNORMAL HIGH (ref 0–200)
HDL: 59.9 mg/dL (ref >39.00)
LDL Cholesterol: 131 mg/dL — ABNORMAL HIGH (ref 0–99)
NonHDL: 144.6
Total CHOL/HDL Ratio: 3
Triglycerides: 69 mg/dL (ref 0.0–149.0)
VLDL: 13.8 mg/dL (ref 0.0–40.0)

## 2023-03-22 LAB — BASIC METABOLIC PANEL
BUN: 6 mg/dL (ref 6–23)
CO2: 28 mEq/L (ref 19–32)
Calcium: 9.1 mg/dL (ref 8.4–10.5)
Chloride: 102 mEq/L (ref 96–112)
Creatinine, Ser: 0.6 mg/dL (ref 0.40–1.20)
GFR: 105.07 mL/min (ref >60.00)
Glucose, Bld: 65 mg/dL — ABNORMAL LOW (ref 70–99)
Potassium: 4.2 mEq/L (ref 3.5–5.1)
Sodium: 140 mEq/L (ref 135–145)

## 2023-04-05 ENCOUNTER — Telehealth: Payer: Self-pay | Admitting: Family Medicine

## 2023-04-06 NOTE — Telephone Encounter (Signed)
error 

## 2023-08-27 ENCOUNTER — Encounter: Payer: Self-pay | Admitting: Family Medicine

## 2023-09-17 ENCOUNTER — Other Ambulatory Visit: Payer: Self-pay | Admitting: Family Medicine

## 2023-09-17 DIAGNOSIS — I10 Essential (primary) hypertension: Secondary | ICD-10-CM

## 2023-09-25 ENCOUNTER — Ambulatory Visit: Payer: BC Managed Care – PPO | Admitting: Family Medicine

## 2023-10-02 ENCOUNTER — Encounter: Payer: Self-pay | Admitting: Family Medicine

## 2023-10-02 ENCOUNTER — Ambulatory Visit: Payer: BC Managed Care – PPO | Admitting: Family Medicine

## 2023-10-02 VITALS — BP 114/74 | HR 79 | Temp 98.4°F | Ht 60.0 in | Wt 124.8 lb

## 2023-10-02 DIAGNOSIS — I10 Essential (primary) hypertension: Secondary | ICD-10-CM

## 2023-10-02 DIAGNOSIS — G35 Multiple sclerosis: Secondary | ICD-10-CM | POA: Diagnosis not present

## 2023-10-02 DIAGNOSIS — K130 Diseases of lips: Secondary | ICD-10-CM | POA: Diagnosis not present

## 2023-10-02 NOTE — Assessment & Plan Note (Signed)
Improved on tacrolimus. I will check vitamin and mineral levels as requested by dermatology.

## 2023-10-02 NOTE — Assessment & Plan Note (Addendum)
Blood pressure is in good control. Continue amlodipine 2.5 mg daily

## 2023-10-02 NOTE — Assessment & Plan Note (Signed)
In remission. She will continue Vumerity under the direction of her neurologist.

## 2023-10-02 NOTE — Progress Notes (Signed)
Kelli Bright [facility] [address] [place] [address] Dept: [phone] Dept Fax: [phone]  Chronic Care Office Visit  Subjective:    Patient ID: Kelli Bright, female    DOB: 12-07-1970, 52 y.o..   MRN: [id number]  Chief Complaint  Patient presents with   Hypertension    6 month f/u HTN.      History of Present Illness:  Patient is in today for reassessment of chronic medical issues.  [name] has a history of hypertension managed amlodipine 2.5 mg daily. She notes she is taking this without problems and pleased with the response.   [name] has a history of MS. She is managed by Dr. Jayme Cloud. She is managed on Vumerity. She notes that overall she is managed well on this medication and that her MS appears to be in full remission.   [name] has been seeing Dr. 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 She was initially placed on a steroid cream. She is now using topical tacrolimus. Her dermatologist has recommended she have testing for several vitamin and mineral deficiencies.  Past Medical History: Patient Active Problem List   Diagnosis Date Noted   Borderline hyperlipidemia 12/22/2020   Essential hypertension 11/11/2019   Migraine 10/10/2019   Numbness and tingling 09/12/2013   Multiple sclerosis (HCC) 09/07/2013   Past Surgical History:  Procedure Laterality Date   NO PAST SURGERIES     Family History  Problem Relation Age of Onset   Hypertension Mother    Skin cancer Mother    CAD Father    Hypertension Father    Skin cancer Father    Colon cancer Neg Hx    Esophageal cancer Neg Hx    Rectal cancer Neg Hx    Stomach cancer Neg Hx    Outpatient Medications Prior to Visit  Medication Sig Dispense Refill   amLODipine (NORVASC) 2.5 MG tablet TAKE 1 TABLET BY MOUTH EVERY DAY 90 tablet 3   cetirizine (ZYRTEC) 10 MG tablet Take 10 mg by mouth daily.     Cholecalciferol (VITAMIN D3) 10  MCG (400 UNIT) CAPS Take by mouth.     CRANBERRY EXTRACT PO Take 2 tablets by mouth daily.     cyclobenzaprine (FLEXERIL) 10 MG tablet cyclobenzaprine 10 mg tablet     Diroximel Fumarate, Starter, (VUMERITY, STARTER,) 231 MG CPDR Vumerity 231 mg capsule,delayed release  Take 2 capsules twice a day by oral route.     ibuprofen (ADVIL,MOTRIN) 200 MG tablet Take 400 mg by mouth every 8 (eight) hours as needed for headache.      Magnesium 250 MG TABS Take by mouth.     Methylcellulose, Laxative, (CITRUCEL PO) Take 1 tablet by mouth daily as needed (for constipation).     Multiple Vitamin (MULTI-VITAMIN) tablet Take by mouth.     Norgestimate-Ethinyl Estradiol Triphasic 0.18/0.215/0.25 MG-35 MCG tablet Take 1 tablet by mouth daily.     OVER THE COUNTER MEDICATION Pro-biotic one every other day.     OVER THE COUNTER MEDICATION Melatonin, one tablet at bedtime.     OVER THE COUNTER MEDICATION Unisom, one tablet at bedtime.     tacrolimus (PROTOPIC) 0.1 % ointment Apply topically 2 (two) times daily as needed.     albuterol (VENTOLIN HFA) 108 (90 Base) MCG/ACT inhaler Inhale 2 puffs into the lungs every 4 (four) hours as needed. (Patient not taking: Reported on 10/02/2023)     No facility-administered medications prior to visit.   Allergies  Allergen Reactions   Hydrogen Peroxide Other (See Comments) and Swelling   Objective:   Today's Vitals   10/02/23 1533  BP: 114/74  Pulse: 79  Temp: 98.4 F (36.9 C)  TempSrc: Temporal  SpO2: 100%  Weight: 124 lb 12.8 oz (56.6 kg)  Height: 5' (1.524 m)   Body mass index is 24.37 kg/m.   General: Well developed, well nourished. No acute distress. Psych: Alert and oriented. Normal mood and affect.  Health Maintenance Due  Topic Date Due   HIV Screening  Never done   Hepatitis C Screening  Never done   Zoster Vaccines- Shingrix (1 of 2) Never done   Cervical Cancer Screening (HPV/Pap Cotest)  02/20/2021     Assessment & Plan:   Problem List  Items Addressed This Visit       Cardiovascular and Mediastinum   Essential hypertension - Primary    Blood pressure is in good control. Continue amlodipine 2.5 mg daily.        Digestive   Angular cheilitis    Improved on tacrolimus. I will check vitamin and mineral levels as requested by dermatology.      Relevant Orders   Vitamin B12   Iron, TIBC and Ferritin Panel   Vitamin B2(Riboflavin),Plasma   Vitamin B6   Zinc     Nervous and Auditory   Multiple sclerosis (HCC)    In remission. She will continue Vumerity under the direction of her neurologist.       Return in about 6 months (around 03/31/2024) for Reassessment.   Loyola Mast, MD

## 2023-10-03 ENCOUNTER — Encounter: Payer: Self-pay | Admitting: Family Medicine

## 2023-10-03 DIAGNOSIS — E611 Iron deficiency: Secondary | ICD-10-CM | POA: Insufficient documentation

## 2023-10-03 LAB — VITAMIN B12: Vitamin B-12: 599 pg/mL (ref 211–911)

## 2023-10-16 LAB — HM PAP SMEAR

## 2023-10-16 LAB — HM MAMMOGRAPHY

## 2023-10-17 ENCOUNTER — Encounter: Payer: Self-pay | Admitting: Family Medicine

## 2023-10-20 LAB — VITAMIN B6: Vitamin B6: 42.6 ng/mL — ABNORMAL HIGH (ref 2.1–21.7)

## 2023-10-20 LAB — IRON,TIBC AND FERRITIN PANEL
%SAT: 14 % — ABNORMAL LOW (ref 16–45)
Ferritin: 13 ng/mL — ABNORMAL LOW (ref 16–232)
Iron: 60 ug/dL (ref 45–160)
TIBC: 443 ug/dL (ref 250–450)

## 2023-10-20 LAB — VITAMIN B2(RIBOFLAVIN),PLASMA: Vitamin B2(Riboflavin),Plasma: 29 nmol/L (ref 6.2–39.0)

## 2023-10-20 LAB — ZINC: Zinc: 68 ug/dL (ref 60–130)

## 2024-04-02 ENCOUNTER — Encounter: Payer: Self-pay | Admitting: Family Medicine

## 2024-04-02 ENCOUNTER — Ambulatory Visit: Payer: BC Managed Care – PPO | Admitting: Family Medicine

## 2024-04-02 VITALS — BP 116/68 | HR 76 | Temp 97.6°F | Ht 60.0 in | Wt 124.6 lb

## 2024-04-02 DIAGNOSIS — G35 Multiple sclerosis: Secondary | ICD-10-CM | POA: Diagnosis not present

## 2024-04-02 DIAGNOSIS — I10 Essential (primary) hypertension: Secondary | ICD-10-CM | POA: Diagnosis not present

## 2024-04-02 DIAGNOSIS — R21 Rash and other nonspecific skin eruption: Secondary | ICD-10-CM | POA: Insufficient documentation

## 2024-04-02 NOTE — Assessment & Plan Note (Signed)
 In remission. She will continue Vumerity under the direction of her neurologist.

## 2024-04-02 NOTE — Assessment & Plan Note (Signed)
 Discussed she may consider consultation with an allergist if the rash persists.

## 2024-04-02 NOTE — Assessment & Plan Note (Addendum)
 Blood pressure is in good control. Continue amlodipine  2.5 mg daily. We will check annual renal labs today.

## 2024-04-02 NOTE — Progress Notes (Signed)
 [facility] [address] [place] [address] Dept: [phone] Dept Fax: [phone]  Chronic Care Office Visit  Subjective:    Patient ID: Kelli Bright, female    DOB: 07-05-1971, 53 y.o..   MRN: [id number]  Chief Complaint  Patient presents with   Hypertension    6 month f/u HTN.  No concerns.     History of Present Illness:  Patient is in today for reassessment of chronic medical issues.  [name] has a history of hypertension. She is managed on amlodipine  2.5 mg daily. She notes she is taking this without problems and pleased with the response.   [name] has a history of MS. She is managed by Dr. Lesta Rater. She is managed on duroximel fumurate (Vumerity). Her insurance is going to stop covering this medication, so she anticipates a change in her medication.  She has not had any recent flares.   [name] has been seeing Dr. Francina Irish (dermatology) related to a worsening facial rash. Ms. Mcdaris notes that multiple different approaches have been tried. None of these have worked so far. She is going to be started on Elidel soon.  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   Borderline hyperlipidemia 12/22/2020   Essential hypertension 11/11/2019   Migraine 10/10/2019   Numbness and tingling 09/12/2013   Multiple sclerosis (HCC) 09/07/2013   Past Surgical History:  Procedure Laterality Date   NO PAST SURGERIES     Family History  Problem Relation Age of Onset   Hypertension Mother    Skin cancer Mother    CAD Father    Hypertension Father    Skin cancer Father    Colon cancer Neg Hx    Esophageal cancer Neg Hx    Rectal cancer Neg Hx    Stomach cancer Neg Hx    Outpatient Medications Prior to Visit  Medication Sig Dispense Refill   albuterol (VENTOLIN HFA) 108 (90 Base)  MCG/ACT inhaler Inhale 2 puffs into the lungs every 4 (four) hours as needed.     amLODipine  (NORVASC ) 2.5 MG tablet TAKE 1 TABLET BY MOUTH EVERY DAY 90 tablet 3   cetirizine (ZYRTEC) 10 MG tablet Take 10 mg by mouth daily.     Cholecalciferol (VITAMIN D3) 10 MCG (400 UNIT) CAPS Take by mouth.     CRANBERRY EXTRACT PO Take 2 tablets by mouth daily.     cyclobenzaprine (FLEXERIL) 10 MG tablet cyclobenzaprine 10 mg tablet     Diroximel Fumarate, Starter, (VUMERITY, STARTER,) 231 MG CPDR Vumerity 231 mg capsule,delayed release  Take 2 capsules twice a day by oral route.     ibuprofen (ADVIL,MOTRIN) 200 MG tablet Take 400 mg by mouth every 8 (eight) hours as needed for headache.      Magnesium 250 MG TABS Take by mouth.     Methylcellulose, Laxative, (CITRUCEL PO) Take 1 tablet by mouth daily as needed (for constipation).     Multiple Vitamin (MULTI-VITAMIN) tablet Take by mouth.     Norgestimate-Ethinyl Estradiol Triphasic 0.18/0.215/0.25 MG-35 MCG tablet Take 1 tablet by mouth daily.     OVER THE COUNTER MEDICATION Pro-biotic one every other day.     OVER THE COUNTER MEDICATION Melatonin, one tablet at bedtime.     OVER THE COUNTER MEDICATION Unisom, one tablet at bedtime.     tacrolimus (PROTOPIC) 0.1 % ointment  Apply topically 2 (two) times daily as needed.     No facility-administered medications prior to visit.   Allergies  Allergen Reactions   Hydrogen Peroxide Other (See Comments) and Swelling   Objective:   Today's Vitals   04/02/24 1535  BP: 116/68  Pulse: 76  Temp: 97.6 F (36.4 C)  TempSrc: Temporal  SpO2: 100%  Weight: 124 lb 9.6 oz (56.5 kg)  Height: 5' (1.524 m)   Body mass index is 24.33 kg/m.   General: Well developed, well nourished. No acute distress. Skin: There is a maculopapular rash with erythema in the nasolabial folds bilaterally. Psych: Alert and oriented. Normal mood and affect.  Health Maintenance Due  Topic Date Due   HIV Screening  Never done    Hepatitis C Screening  Never done   Pneumococcal Vaccine 14-12 Years old (1 of 2 - PCV) Never done   Zoster Vaccines- Shingrix (1 of 2) Never done     Assessment & Plan:   Problem List Items Addressed This Visit       Cardiovascular and Mediastinum   Essential hypertension - Primary   Blood pressure is in good control. Continue amlodipine  2.5 mg daily. We will check annual renal labs today.      Relevant Orders   Basic metabolic panel with GFR     Nervous and Auditory   Multiple sclerosis (HCC)   In remission. She will continue Vumerity under the direction of her neurologist.        Musculoskeletal and Integument   Facial rash   Discussed she may consider consultation with an allergist if the rash persists.       Return in about 6 months (around 10/03/2024) for Reassessment.   Graig Lawyer, MD

## 2024-04-03 ENCOUNTER — Ambulatory Visit: Payer: Self-pay | Admitting: Family Medicine

## 2024-04-03 LAB — BASIC METABOLIC PANEL WITH GFR
BUN: 11 mg/dL (ref 6–23)
CO2: 30 meq/L (ref 19–32)
Calcium: 9.5 mg/dL (ref 8.4–10.5)
Chloride: 101 meq/L (ref 96–112)
Creatinine, Ser: 0.67 mg/dL (ref 0.40–1.20)
GFR: 101.57 mL/min (ref >60.00)
Glucose, Bld: 75 mg/dL (ref 70–99)
Potassium: 3.8 meq/L (ref 3.5–5.1)
Sodium: 140 meq/L (ref 135–145)

## 2024-06-21 ENCOUNTER — Encounter: Payer: Self-pay | Admitting: Family Medicine

## 2024-09-11 ENCOUNTER — Other Ambulatory Visit: Payer: Self-pay | Admitting: Family Medicine

## 2024-09-11 DIAGNOSIS — I10 Essential (primary) hypertension: Secondary | ICD-10-CM

## 2024-10-07 ENCOUNTER — Ambulatory Visit: Admitting: Family Medicine

## 2024-10-07 ENCOUNTER — Encounter: Payer: Self-pay | Admitting: Family Medicine

## 2024-10-07 VITALS — BP 120/76 | HR 83 | Temp 98.1°F | Ht 60.0 in | Wt 123.6 lb

## 2024-10-07 DIAGNOSIS — J0100 Acute maxillary sinusitis, unspecified: Secondary | ICD-10-CM | POA: Diagnosis not present

## 2024-10-07 DIAGNOSIS — I10 Essential (primary) hypertension: Secondary | ICD-10-CM | POA: Diagnosis not present

## 2024-10-07 DIAGNOSIS — G35D Multiple sclerosis, unspecified: Secondary | ICD-10-CM | POA: Diagnosis not present

## 2024-10-07 MED ORDER — AMOXICILLIN-POT CLAVULANATE 875-125 MG PO TABS: 1.0000 | ORAL_TABLET | Freq: Two times a day (BID) | ORAL | 0 refills | Status: AC

## 2024-10-07 NOTE — Assessment & Plan Note (Signed)
 Blood pressure is in good control. Continue amlodipine 2.5 mg daily

## 2024-10-07 NOTE — Assessment & Plan Note (Deleted)
 Discussed she may consider consultation with an allergist if the rash persists.

## 2024-10-07 NOTE — Progress Notes (Signed)
 [facility] [address] [place] [address] Dept: [phone] Dept Fax: [phone]  Chronic Care Office Visit  Subjective:    Patient ID: Kelli Bright, female    DOB: [birth year], 53 y.o..   MRN: [id number]  Chief Complaint  Patient presents with   Hypertension    6 month f/u HTN.  C/o having sinus congestion/cough off/on x 2 weeks.  Has been taking Nyquil and cough drops.     History of Present Illness:  Patient is in today for reassessment of chronic medical conditions.   Ms. Shimer has a history of hypertension. She is managed on amlodipine  2.5 mg daily.    Ms. Gilkey has a history of MS. She is managed by Dr. Ricard. She is managed on duroximel fumurate (Vumerity).  She has not had any recent flares. She works actively to reduce stress in her life.  Ms. Peltz developed nasal congestion 2 weeks ago. She notes that more recently, she is now having sinus pressure and pain. She admits to sensitivity down into her maxillary teeth.   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   Borderline hyperlipidemia 12/22/2020   Essential hypertension 11/11/2019   Migraine 10/10/2019   Numbness and tingling 09/12/2013   Multiple sclerosis 09/07/2013   Past Surgical History:  Procedure Laterality Date   NO PAST SURGERIES     Family History  Problem Relation Age of Onset   Hypertension Mother    Skin cancer Mother    CAD Father    Hypertension Father    Skin cancer Father    Colon cancer Neg Hx    Esophageal cancer Neg Hx    Rectal cancer Neg Hx    Stomach cancer Neg Hx    Outpatient Medications Prior to Visit  Medication Sig Dispense Refill   albuterol (VENTOLIN HFA) 108 (90 Base) MCG/ACT inhaler Inhale 2 puffs into the lungs every 4 (four) hours as needed.     amLODipine  (NORVASC ) 2.5 MG  tablet TAKE 1 TABLET BY MOUTH EVERY DAY 90 tablet 3   cetirizine (ZYRTEC) 10 MG tablet Take 10 mg by mouth daily.     Cholecalciferol (VITAMIN D3) 10 MCG (400 UNIT) CAPS Take by mouth.     CRANBERRY EXTRACT PO Take 2 tablets by mouth daily.     cyclobenzaprine (FLEXERIL) 10 MG tablet cyclobenzaprine 10 mg tablet     Diroximel Fumarate, Starter, (VUMERITY, STARTER,) 231 MG CPDR Vumerity 231 mg capsule,delayed release  Take 2 capsules twice a day by oral route.     ibuprofen (ADVIL,MOTRIN) 200 MG tablet Take 400 mg by mouth every 8 (eight) hours as needed for headache.      Magnesium 250 MG TABS Take by mouth.     Methylcellulose, Laxative, (CITRUCEL PO) Take 1 tablet by mouth daily as needed (for constipation).     Multiple Vitamin (MULTI-VITAMIN) tablet Take by mouth.     Norgestimate-Ethinyl Estradiol Triphasic 0.18/0.215/0.25 MG-35 MCG tablet Take 1 tablet by mouth daily.     OVER THE COUNTER MEDICATION Pro-biotic one every other day.     OVER THE COUNTER MEDICATION Melatonin, one tablet at bedtime.     OVER THE COUNTER MEDICATION Unisom, one tablet at bedtime.     tacrolimus (PROTOPIC) 0.1 % ointment Apply topically 2 (two) times daily as needed.     No facility-administered medications prior  to visit.   Allergies  Allergen Reactions   Hydrogen Peroxide Other (See Comments) and Swelling     Objective:   Today's Vitals   10/07/24 1517  BP: 120/76  Pulse: 83  Temp: 98.1 F (36.7 C)  TempSrc: Temporal  SpO2: 99%  Weight: 123 lb 9.6 oz (56.1 kg)  Height: 5' (1.524 m)   Body mass index is 24.14 kg/m.   General: Well developed, well nourished. No acute distress. HEENT: Normocephalic, non-traumatic. Conjunctiva clear. Nose with mild congestion without rhinorrhea. Pain on percussion   over maxillary sinuses.  Psych: Alert and oriented. Normal mood and affect.  Health Maintenance Due  Topic Date Due   HIV Screening  Never done   Hepatitis C Screening  Never done    Pneumococcal Vaccine: 50+ Years (1 of 2 - PCV) Never done   Hepatitis B Vaccines 19-59 Average Risk (1 of 3 - 19+ 3-dose series) Never done   Zoster Vaccines- Shingrix (1 of 2) Never done   Influenza Vaccine  06/07/2024   Assessment & Plan:   Problem List Items Addressed This Visit       Cardiovascular and Mediastinum   Essential hypertension - Primary   Blood pressure is in good control. Continue amlodipine  2.5 mg daily.         Nervous and Auditory   Multiple sclerosis   In remission. She will continue Vumerity under the direction of her neurologist.      Other Visit Diagnoses       Acute non-recurrent maxillary sinusitis       I will prescribe a 10-day course of Augmentin.   Relevant Medications   amoxicillin-clavulanate (AUGMENTIN) 875-125 MG tablet       Return in about 6 months (around 04/07/2025) for Reassessment.   Garnette CHRISTELLA Simpler, MD  I,Emily Lagle,acting as a scribe for Garnette CHRISTELLA Simpler, MD.,have documented all relevant documentation on the behalf of Garnette CHRISTELLA Simpler, MD.  I, Garnette CHRISTELLA Simpler, MD, have reviewed all documentation for this visit. The documentation on 10/07/2024 for the exam, diagnosis, procedures, and orders are all accurate and complete.

## 2024-10-07 NOTE — Assessment & Plan Note (Signed)
 In remission. She will continue Vumerity under the direction of her neurologist.

## 2024-10-23 ENCOUNTER — Encounter: Payer: Self-pay | Admitting: Family Medicine

## 2025-04-07 ENCOUNTER — Ambulatory Visit: Admitting: Family Medicine
